# Patient Record
Sex: Female | Born: 1945 | Race: White | Hispanic: No | Marital: Married | Smoking: Never smoker
Health system: Southern US, Community
[De-identification: ages and names within clinical notes are randomized; demographics above are authoritative.]

## PROBLEM LIST (undated history)

## (undated) DIAGNOSIS — I10 Essential (primary) hypertension: Secondary | ICD-10-CM

## (undated) DIAGNOSIS — E785 Hyperlipidemia, unspecified: Secondary | ICD-10-CM

## (undated) DIAGNOSIS — H1010 Acute atopic conjunctivitis, unspecified eye: Secondary | ICD-10-CM

## (undated) HISTORY — DX: Hyperlipidemia, unspecified: E78.5

## (undated) HISTORY — PX: TONSILLECTOMY: SUR1361

## (undated) HISTORY — PX: FOOT SURGERY: SHX648

## (undated) HISTORY — DX: Acute atopic conjunctivitis, unspecified eye: H10.10

## (undated) HISTORY — DX: Essential (primary) hypertension: I10

---

## 1998-10-29 ENCOUNTER — Other Ambulatory Visit: Admission: RE | Admit: 1998-10-29 | Discharge: 1998-10-29 | Payer: Self-pay | Admitting: Gynecology

## 1999-12-16 ENCOUNTER — Other Ambulatory Visit: Admission: RE | Admit: 1999-12-16 | Discharge: 1999-12-16 | Payer: Self-pay | Admitting: Gynecology

## 2001-03-22 ENCOUNTER — Other Ambulatory Visit: Admission: RE | Admit: 2001-03-22 | Discharge: 2001-03-22 | Payer: Self-pay | Admitting: Gynecology

## 2002-10-01 ENCOUNTER — Other Ambulatory Visit: Admission: RE | Admit: 2002-10-01 | Discharge: 2002-10-01 | Payer: Self-pay | Admitting: Gynecology

## 2004-03-18 ENCOUNTER — Ambulatory Visit: Payer: Self-pay | Admitting: Internal Medicine

## 2004-08-23 ENCOUNTER — Ambulatory Visit: Payer: Self-pay | Admitting: Internal Medicine

## 2004-08-30 ENCOUNTER — Other Ambulatory Visit: Admission: RE | Admit: 2004-08-30 | Discharge: 2004-08-30 | Payer: Self-pay | Admitting: Gynecology

## 2004-09-13 ENCOUNTER — Ambulatory Visit: Payer: Self-pay | Admitting: Internal Medicine

## 2005-01-26 ENCOUNTER — Ambulatory Visit: Payer: Self-pay | Admitting: Internal Medicine

## 2005-05-05 ENCOUNTER — Ambulatory Visit: Payer: Self-pay | Admitting: Internal Medicine

## 2005-09-27 ENCOUNTER — Ambulatory Visit: Payer: Self-pay | Admitting: Internal Medicine

## 2005-11-17 ENCOUNTER — Ambulatory Visit: Payer: Self-pay | Admitting: Internal Medicine

## 2006-02-20 ENCOUNTER — Ambulatory Visit: Payer: Self-pay | Admitting: Internal Medicine

## 2006-05-23 ENCOUNTER — Ambulatory Visit: Payer: Self-pay | Admitting: Internal Medicine

## 2006-09-27 ENCOUNTER — Ambulatory Visit: Payer: Self-pay | Admitting: Internal Medicine

## 2006-11-27 ENCOUNTER — Ambulatory Visit: Payer: Self-pay | Admitting: Internal Medicine

## 2007-06-13 ENCOUNTER — Ambulatory Visit: Payer: Self-pay | Admitting: Internal Medicine

## 2007-09-26 DIAGNOSIS — J302 Other seasonal allergic rhinitis: Secondary | ICD-10-CM | POA: Insufficient documentation

## 2007-09-26 DIAGNOSIS — H1045 Other chronic allergic conjunctivitis: Secondary | ICD-10-CM | POA: Insufficient documentation

## 2007-09-26 DIAGNOSIS — J3089 Other allergic rhinitis: Secondary | ICD-10-CM

## 2007-09-26 DIAGNOSIS — J4520 Mild intermittent asthma, uncomplicated: Secondary | ICD-10-CM | POA: Insufficient documentation

## 2007-09-27 ENCOUNTER — Ambulatory Visit: Payer: Self-pay | Admitting: Internal Medicine

## 2008-01-22 ENCOUNTER — Ambulatory Visit: Payer: Self-pay | Admitting: Internal Medicine

## 2008-08-05 ENCOUNTER — Ambulatory Visit: Payer: Self-pay | Admitting: Internal Medicine

## 2008-09-05 ENCOUNTER — Ambulatory Visit (HOSPITAL_BASED_OUTPATIENT_CLINIC_OR_DEPARTMENT_OTHER): Admission: RE | Admit: 2008-09-05 | Discharge: 2008-09-05 | Payer: Self-pay | Admitting: Urology

## 2008-09-05 ENCOUNTER — Encounter (INDEPENDENT_AMBULATORY_CARE_PROVIDER_SITE_OTHER): Payer: Self-pay | Admitting: Urology

## 2008-11-13 ENCOUNTER — Ambulatory Visit: Payer: Self-pay | Admitting: Internal Medicine

## 2009-04-01 ENCOUNTER — Ambulatory Visit: Payer: Self-pay | Admitting: Internal Medicine

## 2009-04-10 ENCOUNTER — Telehealth: Payer: Self-pay | Admitting: Internal Medicine

## 2009-10-22 ENCOUNTER — Ambulatory Visit: Payer: Self-pay | Admitting: Internal Medicine

## 2009-11-13 ENCOUNTER — Ambulatory Visit: Payer: Self-pay | Admitting: Internal Medicine

## 2010-03-09 NOTE — Progress Notes (Signed)
Summary: rx request   Phone Note Call from Patient Call back at Home Phone [phone]   Caller: Patient Call For: [name] [name] Summary of Call: pt states she was told to call when she feels she needs a rx for DOXYCYCLINE. [facility] [place] Initial call taken by: Tivis Ringer, CNA,  April 10, 2009 3:18 PM  Follow-up for Phone Call        called pt's home #.  Spoke to spouse.  Spouse states pt is currently at work.  Gave me work #  [id number] and cell #  [phone].  [id number].  Aundra Millet Reynolds LPN  April 11, 979 4:44 PM    Additional Follow-up for Phone Call Additional follow up Details #1::        Called and spoke with pt.  She c/o head congestion, "gooky eyes", and dry cough x 4 days.  She states that she needs doxycycline called in so that "it does'nt go to my chest". Please advise, thanks Vernie Murders  April 13, 2009 4:55 PM NKDA     Additional Follow-up for Phone Call Additional follow up Details #2::    please send script doxycycline 100 mg, # 8,      2 today then one daily. Follow-up by: Waymon Budge MD,  April 13, 2009 5:08 PM  Additional Follow-up for Phone Call Additional follow up Details #3:: Details for Additional Follow-up Action Taken: rx sent. pt aware. Carron Curie CMA  April 13, 2009 5:18 PM   New/Updated Medications: DOXYCYCLINE HYCLATE 100 MG TABS (DOXYCYCLINE HYCLATE) 2 today then one daily Prescriptions: DOXYCYCLINE HYCLATE 100 MG TABS (DOXYCYCLINE HYCLATE) 2 today then one daily  #8 x 0   Entered by:   Carron Curie CMA   Authorized by:   Waymon Budge MD   Signed by:   Carron Curie CMA on 04/13/2009   Method used:   [facility]        [facility] [address]       [address]       [place], [place]  [address]       Ph: [phone] or [phone]       Fax: [phone]   RxID:   [id number]

## 2010-03-09 NOTE — Assessment & Plan Note (Signed)
Summary: 1 year/ mbw   Primary Provider/Referring Provider:  Sigmund Hazel   CC:  Yearly follow up visit-allergies; no major flare ups..  History of Present Illness: 65 year old woman returning for one year follow-up of allergic rhinitis and asthma.  Never smoked.  This has  been a good summer.  One.  Viral syndrome bronchitis episode, resolved.  ICH, and burn despite using Pataday eyedrops on a daily basis.  Allegra-D p.r.n. Never much asthma.  Wears a mask when she is working in the attic to keep out dust.  November 13, 2008- Allergic rhinitis, asthma, conjunctivitis Very good past year. No significant asthma since last here. Uses Mucinex early if she senses trouble. allergy vaccine has served her well with no problems. Rarely needs doxycycline. Rare need for Allegra-D. Uses rescue inhaler very occasionally- discussed.  November 13, 2009- Allergic rhinitis, asthma, conjunctivitis cc: Yearly follow up visit-allergies; no major flare ups. Continues allergy vaccine. No real problem with seasonal changes and she notes lack of any acute attacks requiring visits, so it's been a good year. Hasn't needed Allegra D and finds mucinex -DM works best for her. Needs Epipen refill. Asks standby script for doxycycline. Needs flu vax. Rarely gets a little chest tightness- not enough to need treatment. Denies discomfort with eyes or ears lately.    Asthma History    Initial Asthma Severity Rating:    Age range: 12+ years    Symptoms: 0-2 days/week    Nighttime Awakenings: 0-2/month    Interferes w/ normal activity: no limitations    SABA use (not for EIB): 0-2 days/week    Asthma Severity Assessment: Intermittent   Preventive Screening-Counseling & Management  Alcohol-Tobacco     Smoking Status: never     Passive Smoke Exposure: no  Current Medications (verified): 1)  Bayer Aspirin Ec Low Dose 81 Mg  Tbec (Aspirin) .... Take 1 Tablet By Mouth Once A Day 2)  Ventolin Hfa 108 (90 Base) Mcg/act Aers  (Albuterol Sulfate) .... 2 Puffs Four Times A Day As Needed Rescue 3)  Allegra-D 12 Hour 60-120 Mg  Xr12h-Tab (Fexofenadine-Pseudoephedrine) .... Take 1 Tablet By Mouth Two Times A Day As Needed 4)  Pataday 0.2 %  Soln (Olopatadine Hcl) .Marland Kitchen.. 1 Spray Each Nostril Once Daily As Needed 5)  Epipen 0.3 Mg/0.45ml (1:1000)  Devi (Epinephrine Hcl (Anaphylaxis)) .... Use As Directed As Needed For Anaphylaxis 6)  Allergy Vaccine (W-E) Go .Marland Kitchen.. 1:10 7)  Amitriptyline Hcl 25 Mg Tabs (Amitriptyline Hcl) .... Take 1 By Mouth Once Daily 8)  Gnp Antacid Ultra Strength 1000 Mg Chew (Calcium Carbonate Antacid) .... Take 1 By Mouth Once Daily 9)  Vitamin D 1000 Unit Tabs (Cholecalciferol) .... Take 2 By Mouth Once Daily 10)  Multivitamins  Tabs (Multiple Vitamin) .... Take 1 By Mouth Once Daily 11)  Potassium 99 Mg Tabs (Potassium) .... Take 1 By Mouth Once Daily 12)  Doxycycline Hyclate 100 Mg Tabs (Doxycycline Hyclate) .... 2 Today Then One Daily  Allergies (verified): No Known Drug Allergies  Past History:  Past Medical History: Last updated: 09/27/2007 ALLERGIC CONJUNCTIVITIS (ICD-372.14) ALLERGIC RHINITIS (ICD-477.9) ASTHMA (ICD-493.90)  Past Surgical History: Last updated: 11/13/2008 Foot surgery Tonsillectomy  Family History: Last updated: 10-07-2007 Mother- deceased age 38; Allergies, Asthma, Heart Disease, Arthritis, Breast Cancer Father- living age 47; DM, Allergies, Heart Disease Sibling 1- living age 64  Social History: Last updated: 2007/10/07 Patient never smoked.  Negative history of passive tobacco smoke exposure.  Exercise- 3 times weekly Caffeine  Use- no ETOH- 1 drink weekly Married with 2 children   Risk Factors: Smoking Status: never (11/13/2009) Passive Smoke Exposure: no (11/13/2009)  Review of Systems      See HPI  The patient denies shortness of breath with activity, shortness of breath at rest, productive cough, non-productive cough, coughing up blood, chest  pain, irregular heartbeats, indigestion, loss of appetite, weight change, abdominal pain, difficulty swallowing, sore throat, tooth/dental problems, headaches, nasal congestion/difficulty breathing through nose, sneezing, itching, ear ache, rash, change in color of mucus, and fever.         Increased reflux and dry mouth blamed on amitriptyline she takes for her bladder.  Vital Signs:  Patient profile:   65 year old female Height:      66.5 inches Weight:      177.50 pounds BMI:     28.32 O2 Sat:      96 % on Room air Pulse rate:   88 / minute BP sitting:   122 / 76  (left arm) Cuff size:   regular  Vitals Entered By: Reynaldo Minium CMA (November 13, 2009 9:34 AM)  O2 Flow:  Room air CC: Yearly follow up visit-allergies; no major flare ups.   Physical Exam  Additional Exam:  General: A/Ox3; pleasant and cooperative, NAD, SKIN: no rash, lesions NODES: no lymphadenopathy NECK: Supple w/ fair ROM, JVD- none, normal carotid impulses w/o bruits Thyroid- normal to palpation, CHEST: Clear to P&A HEART: RRR, no m/g/r heard ABDOMEN: Soft and nl; nml bowel sounds;  ZOX:WRUE, nl pulses, no edema  NEURO: Grossly intact to observation HEENT: [place]/AT, EOM- wnl, Conjunctivae- clear, contact lenses, PERRLA, TMs- wnl, Nose- clear, Throat- clear and wnl, Mallampati II     Impression & Recommendations:  Problem # 1:  ALLERGIC RHINITIS (ICD-477.9)  Good control over the past year. She will continue present meds and allergy vaccine. Amitriptyline likely helps rhinorhea some through drying effect. Refilling Epipen.  Problem # 2:  ASTHMA (ICD-493.90) Minimal intermittent asthma, noted and mainly historical with no treatment needed.   Medications Added to Medication List This Visit: 1)  Doxycycline Hyclate 100 Mg Caps (Doxycycline hyclate) .... 2 today then one daily  Other Orders: Est. Patient Level III (45409)  Patient Instructions: 1)  Please schedule a follow-up appointment in 1  year. 2)  Good control with no additional interventions needed. 3)  Flu vax Prescriptions: EPIPEN 0.3 MG/0.3ML (1:1000)  DEVI (EPINEPHRINE HCL (ANAPHYLAXIS)) Use as directed as needed for anaphylaxis  #1 x prn   Entered and Authorized by:   Waymon Budge MD   Signed by:   Waymon Budge MD on 11/13/2009   Method used:   Print then Give to Patient   RxID:   [id number] DOXYCYCLINE HYCLATE 100 MG CAPS (DOXYCYCLINE HYCLATE) 2 today then one daily  #8 x 3   Entered and Authorized by:   Waymon Budge MD   Signed by:   Waymon Budge MD on 11/13/2009   Method used:   Print then Give to Patient   RxID:   [id number]     Appended Document: flu vaccine documentation Flu Vaccine Consent Questions     Do you have a history of severe allergic reactions to this vaccine? no    Any prior history of allergic reactions to egg and/or gelatin? no    Do you have a sensitivity to the preservative Thimersol? no    Do you have a past history of Guillan-Barre Syndrome? no  Do you currently have an acute febrile illness? no    Have you ever had a severe reaction to latex? no    Vaccine information given and explained to patient? yes    Are you currently pregnant? no    Lot Number:[id number]   Exp Date:08/07/2010   Site Given Right  Deltoid IM Armita Galloway RCP, LPN  November 13, 2009 11:01 AM    Clinical Lists Changes  Orders: Added new Service order of Admin 1st Vaccine (16109) - Signed Added new Service order of Flu Vaccine 67yrs + [phone]) - Signed Observations: Added new observation of FLU VAX VIS: 09/01/09 version (11/13/2009 11:00) Added new observation of FLU VAXLOT: [id number] (11/13/2009 11:00) Added new observation of FLU VAXMFR: Glaxosmithkline (11/13/2009 11:00) Added new observation of FLU VAX EXP: 08/07/2010 (11/13/2009 11:00) Added new observation of FLU VAX DSE: 0.46ml (11/13/2009 11:00) Added new observation of FLU VAX: Fluvax 3+ (11/13/2009 11:00)

## 2010-06-22 NOTE — Assessment & Plan Note (Signed)
[facility]                             PULMONARY OFFICE NOTE   NAME:[name] [name] KJOS                       MRN:          [id number]  DATE:09/27/2006                            DOB:          1945/02/12    PROBLEM:  1. Asthma.  2. Allergic rhinitis.  3. Allergic conjunctivitis.   HISTORY:  A six month followup. This has been a good summer except that  she has to use pataday eye drops every day for watery eyes. She actually  does better if she wears her contact lenses. Otherwise, she has done  quite well.   MEDICATIONS:  1. Allergy vaccine continues it at 1:10 with no problems.  2. Black cohosh.  3. Aspirin 81 mg.  4. Allegra D 12 hour.  5. P.r.n. use of doxycycline.  6. Pataday eye drops.  7. She has an Epipen.   DRUG INTOLERANCES:  No medication allergy.   OBJECTIVE:  Weight 161 pounds, blood pressure 112/64, pulse 68.  Room  air saturation: 98%.  She looks comfortable.  Conjunctivae are not injected. Secretions are clear. Nose and throat are  clear.  CHEST: Clear.  HEART: Sounds regular without murmur.   IMPRESSION:  Stable management of allergic rhinitis, conjunctivitis and  possibly mild intermittent asthma.   PLAN:  1. Continue vaccine.  2. Continue Pataday with some discussion. She will drop back enough to      make sure she still needs it.  3. We refilled doxycycline #8 to last seven days for appropriate      p.r.n. use as discussed.  4. I have refilled Allegra D 12 hour one b.i.d. p.r.n. which she      prefers over Claritin or Zyrtec.  5. Schedule return in 12 months, earlier p.r.n.     Clinton D. Maple Hudson, MD, Tonny Bollman, FACP  Electronically Signed    CDY/MedQ  DD: 09/27/2006  DT: 09/28/2006  Job #: [id number]   cc:   Sigmund Hazel, M.D.

## 2010-06-22 NOTE — Op Note (Signed)
NAMEBROOKELYN, GAYNOR NO.:  [id number]   MEDICAL RECORD NO.:  [id number]          PATIENT TYPE:  AMB   LOCATION:  [facility]                         FACILITY:  [facility]   PHYSICIAN:  Maretta Bees. Vonita Moss, M.D.DATE OF BIRTH:  [birth year]   DATE OF PROCEDURE:  09/05/2008  DATE OF DISCHARGE:                               OPERATIVE REPORT   PREOPERATIVE DIAGNOSIS:  Rule out interstitial cystitis.   POSTOPERATIVE DIAGNOSIS:  Interstitial cystitis.   PROCEDURES:  Cystoscopy, hydraulic overdistention of bladder and cold  cup bladder biopsy.   SURGEON:  Dr. Larey Dresser.   ANESTHESIA:  General.   INDICATIONS:  This 65 year old lady has had an 13-month history of  progressive frequency of urination.  She voids every 15 minutes.  She  has burning in the bladder region.  She has nocturia times three.  She  also has some urinary leakage.  The pelvic pain and symptom score was  quite elevated to 28.  I feel like she had interstitial cystitis.  I  advised her about cysto and HOD and bladder biopsy and the risk of  bladder perforation and postop pain and hematuria.   PROCEDURE:  The patient brought to the operating room and placed in  lithotomy position.  External genitalia were prepped and draped in usual  fashion.  The bladder appeared initially to be somewhat pale and mucosa  was generally abnormal-appearing in that regard.  After only filling her  with 200 or 300 mL she started getting petechiae and hemorrhage and held  less than 500 mL.  She had widespread submucosal petechiae and gross  hemorrhage.  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  There was no evidence of bladder perforation.  I  did cold cup bladder biopsies in typical hemorrhagic areas across the  bladder base and fulgurated these biopsy sites.  At this point the  bleeding had stopped significantly.  The bladder was emptied, the scope  removed.  The patient sent to recovery  room in good condition.  B and O  suppository was inserted per rectum.  She tolerated the procedure well.  Blood loss was minimal.      Maretta Bees. Vonita Moss, M.D.  Electronically Signed     LJP/MEDQ  D:  09/05/2008  T:  09/05/2008  Job:  [id number]

## 2010-06-25 NOTE — Assessment & Plan Note (Signed)
[name] [name]                             PULMONARY OFFICE NOTE   [name] [name], [name] [name]                       MRN:          [id number]  DATE:02/20/2006                            DOB:          Jan 09, 1946    February 20, 2006   PROBLEMS:  1. Asthma.  2. Allergic rhinitis.  3. Allergic conjunctivitis.   HISTORY:  She has had cough without sore throat.  There has been some  nasal congestion and post nasal drainage.  Mucinex helped.  She is not  bringing up any sputum.  There has been no fever.  She continues her  allergy vaccine at 1:10 with no problems.   MEDICATIONS:  Allergy vaccine, black kohash, aspirin, Allegra D, p.r.n.  use of Patanol eye drops, Epipen is available.   ALLERGIES:  NO KNOWN DRUG ALLERGIES.   OBJECTIVE:  VITAL SIGNS:  Weight 174 pounds, blood pressure 142/84,  pulse 61, room air saturation 100%.  HEENT:  Her nose is slightly congested.  CHEST:  Wheezy cough.  No accessory muscle use.  CARDIOVASCULAR:  Normal heart sounds.   IMPRESSION:  Exacerbation of asthma, probably after a viral infection.  Some increase in rhinitis.  I do not think she has sinusitis yet.   PLAN:  1. Nebulizer treatment with Xopenex 0.63.  2. Depo 80.  3. Keep scheduled appointment for the spring, earlier p.r.n.     Clinton D. Maple Hudson, MD, Tonny Bollman, FACP  Electronically Signed    CDY/MedQ  DD: 02/20/2006  DT: 02/21/2006  Job #: [id number]

## 2010-06-25 NOTE — Assessment & Plan Note (Signed)
[facility]                               PULMONARY OFFICE NOTE   NAME:[name] [name], [name] [name]                       MRN:          [id number]  DATE:09/27/2005                            DOB:          05-Aug-1945    PROBLEM:  1. Asthma.  2. Allergic rhinitis.  3. Allergic conjunctivitis.   HISTORY:  One-year followup.  She was last seen here in December by the  nurse practitioner with an acute bronchitis exacerbation treated with  Omnicef and a prednisone taper.  Since then, she has done well, except for  complaints.  Her eyes excessively at night or at least by night time.  She  has continued medication including Allegra D, Patanol, aspirin 81 mg and her  allergy vaccine 1-10.  She gives her own vaccine.  There have been no  problems, but we spent time again today discussing risks, benefit and goals  of allergy vaccine therapy, issues of administration outside the medical  office, anaphylaxis, epinephrine and choices.  She wishes to continue as she  is doing and has reviewed and signed our waiver.  She is given a refill on  her Epi-Pen prescription.   OBJECTIVE:  VITAL SIGNS:  Weight 168 pounds, BP 138/78, pulse regular 59,  room air saturation 100%.  HEENT:  She is wearing contact lenses with no conjunctival injection.  Secretions are clear.  Her nasal mucosa looks normal and is unobstructed.  Pharynx is clear.  LUNGS:  Clear to P&A.  HEART:  Sounds regular without murmur.   IMPRESSION:  Allergic rhinitis, possible non-specific overflow lacrimation,  rare mild asthma.   PLAN:  1. Refill Allegra-D 12 hour.  2. Risk education discussion as above with epinephrine refill waiver      signed.  3. Lacrilube ointment.  4. Standby prescription at her request for doxycycline, a 7-day taper to      take if needed, with appropriate discussion.  5. Schedule return in one year, earlier p.r.n.                                   Clinton D. Maple Hudson, MD, FCCP,  FACP   CDY/MedQ  DD:  09/29/2005  DT:  09/29/2005  Job #:  [id number]

## 2010-11-10 ENCOUNTER — Telehealth: Payer: Self-pay | Admitting: Pulmonary Disease

## 2010-11-10 NOTE — Telephone Encounter (Signed)
ERROR: WRONG PT ///KP

## 2010-11-11 ENCOUNTER — Encounter: Payer: Self-pay | Admitting: Internal Medicine

## 2010-11-12 ENCOUNTER — Ambulatory Visit (INDEPENDENT_AMBULATORY_CARE_PROVIDER_SITE_OTHER): Payer: BC Managed Care – PPO | Admitting: Internal Medicine

## 2010-11-12 ENCOUNTER — Encounter: Payer: Self-pay | Admitting: Internal Medicine

## 2010-11-12 VITALS — BP 124/82 | HR 85 | Ht 66.5 in | Wt 176.0 lb

## 2010-11-12 DIAGNOSIS — J45909 Unspecified asthma, uncomplicated: Secondary | ICD-10-CM

## 2010-11-12 DIAGNOSIS — Z23 Encounter for immunization: Secondary | ICD-10-CM

## 2010-11-12 DIAGNOSIS — H1045 Other chronic allergic conjunctivitis: Secondary | ICD-10-CM

## 2010-11-12 DIAGNOSIS — J309 Allergic rhinitis, unspecified: Secondary | ICD-10-CM

## 2010-11-12 MED ORDER — ALBUTEROL SULFATE HFA 108 (90 BASE) MCG/ACT IN AERS: 2.0000 | INHALATION_SPRAY | RESPIRATORY_TRACT | Status: DC | PRN

## 2010-11-12 MED ORDER — EPINEPHRINE 0.3 MG/0.3ML IJ DEVI: 0.3000 mg | INTRAMUSCULAR | Status: DC | PRN

## 2010-11-12 MED ORDER — OLOPATADINE HCL 0.2 % OP SOLN: 1.0000 [drp] | OPHTHALMIC | Status: AC

## 2010-11-12 NOTE — Patient Instructions (Addendum)
Flu vax  Refill scripts for Epipen, Ventolin rescue inhaler and Pataday  Ask your eye doctor if you could use a cheaper otc allergy eye drop like Visine AC, Naphcon or Allaway  Please call as needed

## 2010-11-12 NOTE — Progress Notes (Signed)
11/12/10-65 year old female never smoker followed for asthma, allergic rhinitis Last here 11/13/2009 She continues allergy vaccine without difficulties. Needs update on EpiPen. Overall this has been a good year and she has no concerns. Eyes burn and water at times. Pataday eyedrops are expensive, but she wears contact lenses and is limited in what she can use. We discussed some over-the-counter products which she can ask her eye doctor about. Has had one or 2 very mild episodes of chest tightness with little need for rescue inhaler. She does want to keep her rescue inhaler available. Asks flu vaccine.  ROS Constitutional:   No-   weight loss, night sweats, fevers, chills, fatigue, lassitude. HEENT:   No-  headaches, difficulty swallowing, tooth/dental problems, sore throat,       No-  sneezing, itching, ear ache, nasal congestion, post nasal drip,  CV:  No-   chest pain, orthopnea, PND, swelling in lower extremities, anasarca, dizziness, palpitations Resp: No-   shortness of breath with exertion or at rest.              No-   productive cough,  No non-productive cough,  No-  coughing up of blood.              No-   change in color of mucus.  Very minimal wheezing.   Skin: No-   rash or lesions. GI:  No-   heartburn, indigestion, abdominal pain, nausea, vomiting, diarrhea,                 change in bowel habits, loss of appetite GU: No-   dysuria, change in color of urine, no urgency or frequency.  No- flank pain. MS:  No-   joint pain or swelling.  No- decreased range of motion.  No- back pain. Neuro- grossly normal to observation, Or:  Psych:  No- change in mood or affect. No depression or anxiety.  No memory loss.  OBJ General- Alert, Oriented, Affect-appropriate, Distress- none acute Skin- rash-none, lesions- none, excoriation- none Lymphadenopathy- none Head- atraumatic            Eyes- Gross vision intact, PERRLA, conjunctivae clear secretions            Ears- Hearing, canals-normal            Nose- Clear, no-Septal dev, mucus, polyps, erosion, perforation             Throat- Mallampati III , mucosa clear , drainage- none, tonsils- atrophic Neck- flexible , trachea midline, no stridor , thyroid nl, carotid no bruit Chest - symmetrical excursion , unlabored           Heart/CV- RRR , no murmur , no gallop  , no rub, nl s1 s2                           - JVD- none , edema- none, stasis changes- none, varices- none           Lung- clear to P&A, wheeze- none, cough- none , dullness-none, rub- none           Chest wall-  Abd- tender-no, distended-no, bowel sounds-present, HSM- no Br/ Gen/ Rectal- Not done, not indicated Extrem- cyanosis- none, clubbing, none, atrophy- none, strength- nl Neuro- grossly intact to observation

## 2010-11-14 NOTE — Assessment & Plan Note (Signed)
We will refill her Ventolin rescue inhaler as discussed, but she may go most of the year without needing it.

## 2010-11-14 NOTE — Assessment & Plan Note (Signed)
She will ask her ophthalmologist about cheaper alternatives to Pataday eyedrops

## 2010-11-14 NOTE — Assessment & Plan Note (Signed)
Good control. She is satisfied

## 2010-12-13 ENCOUNTER — Other Ambulatory Visit: Payer: Self-pay | Admitting: Gynecology

## 2011-03-22 ENCOUNTER — Ambulatory Visit (INDEPENDENT_AMBULATORY_CARE_PROVIDER_SITE_OTHER): Payer: BC Managed Care – PPO

## 2011-03-22 DIAGNOSIS — J309 Allergic rhinitis, unspecified: Secondary | ICD-10-CM

## 2011-11-14 ENCOUNTER — Ambulatory Visit (INDEPENDENT_AMBULATORY_CARE_PROVIDER_SITE_OTHER): Payer: BC Managed Care – PPO | Admitting: Internal Medicine

## 2011-11-14 ENCOUNTER — Encounter: Payer: Self-pay | Admitting: Internal Medicine

## 2011-11-14 VITALS — BP 122/64 | HR 91 | Ht 66.5 in | Wt 181.4 lb

## 2011-11-14 DIAGNOSIS — Z23 Encounter for immunization: Secondary | ICD-10-CM

## 2011-11-14 DIAGNOSIS — J309 Allergic rhinitis, unspecified: Secondary | ICD-10-CM

## 2011-11-14 DIAGNOSIS — H1045 Other chronic allergic conjunctivitis: Secondary | ICD-10-CM

## 2011-11-14 DIAGNOSIS — J45909 Unspecified asthma, uncomplicated: Secondary | ICD-10-CM

## 2011-11-14 MED ORDER — EPINEPHRINE 0.3 MG/0.3ML IJ DEVI: 0.3000 mg | INTRAMUSCULAR | Status: AC | PRN

## 2011-11-14 MED ORDER — ALBUTEROL SULFATE HFA 108 (90 BASE) MCG/ACT IN AERS: 2.0000 | INHALATION_SPRAY | RESPIRATORY_TRACT | Status: DC | PRN

## 2011-11-14 NOTE — Patient Instructions (Addendum)
Refill scripts for Epipen and for rescue inhaler sent  We can continue allergy vaccine  Flu Vax  TDAP vax  Please call as needed

## 2011-11-14 NOTE — Progress Notes (Signed)
11/12/10-66 year old female never smoker followed for asthma, allergic rhinitis Last here 11/13/2009 She continues allergy vaccine without difficulties. Needs update on EpiPen. Overall this has been a good year and she has no concerns. Eyes burn and water at times. Pataday eyedrops are expensive, but she wears contact lenses and is limited in what she can use. We discussed some over-the-counter products which she can ask her eye doctor about. Has had one or 2 very mild episodes of chest tightness with little need for rescue inhaler. She does want to keep her rescue inhaler available. Asks flu vaccine.  11/14/11- 66 year old female never smoker followed for asthma, allergic rhinitis Slight cough from weather changes but not productive; still on allergy vaccine 1:10 GO and doing well. Has done well overall for the past year and satisfied with treatment. Has changed to an over-the-counter eyedrop.  ROS- see HPI Constitutional:   No-   weight loss, night sweats, fevers, chills, fatigue, lassitude. HEENT:   No-  headaches, difficulty swallowing, tooth/dental problems, sore throat,       No-  sneezing, itching, ear ache, nasal congestion, post nasal drip,  CV:  No-   chest pain, orthopnea, PND, swelling in lower extremities, anasarca, dizziness, palpitations Resp: No-   shortness of breath with exertion or at rest.              No-   productive cough,  No non-productive cough,  No-  coughing up of blood.              No-   change in color of mucus.  No- wheezing.   Skin: No-   rash or lesions. GI:  No-   heartburn, indigestion, abdominal pain, nausea, vomiting, GU: . MS:  No-   joint pain or swelling.  . Neuro- nothing unusual Psych:  No- change in mood or affect. No depression or anxiety.  No memory loss.  OBJ General- Alert, Oriented, Affect-appropriate, Distress- none acute Skin- rash-none, lesions- none, excoriation- none Lymphadenopathy- none Head- atraumatic            Eyes- Gross vision  intact, PERRLA, conjunctivae + slightly injected            Ears- Hearing, canals-normal            Nose- Clear, no-Septal dev, mucus, polyps, erosion, perforation             Throat- Mallampati III , mucosa clear , drainage- none, tonsils- atrophic Neck- flexible , trachea midline, no stridor , thyroid nl, carotid no bruit Chest - symmetrical excursion , unlabored           Heart/CV- RRR , no murmur , no gallop  , no rub, nl s1 s2                           - JVD- none , edema- none, stasis changes- none, varices- none           Lung- clear to P&A, wheeze- none, cough- none , dullness-none, rub- none           Chest wall-  Abd-  Br/ Gen/ Rectal- Not done, not indicated Extrem- cyanosis- none, clubbing, none, atrophy- none, strength- nl Neuro- grossly intact to observation

## 2011-11-20 NOTE — Assessment & Plan Note (Signed)
Over-the-counter allergy eyedrops is more cost effective for her than Pataday

## 2011-11-20 NOTE — Assessment & Plan Note (Signed)
Plan-continue allergy vaccine. Refill EpiPen.

## 2011-11-20 NOTE — Assessment & Plan Note (Signed)
Good control. Plan-refill rescue inhaler. She requests TDAP and flu shots

## 2012-01-27 ENCOUNTER — Ambulatory Visit (INDEPENDENT_AMBULATORY_CARE_PROVIDER_SITE_OTHER): Payer: BC Managed Care – PPO

## 2012-01-27 DIAGNOSIS — J309 Allergic rhinitis, unspecified: Secondary | ICD-10-CM

## 2012-11-13 ENCOUNTER — Ambulatory Visit: Payer: BC Managed Care – PPO | Admitting: Internal Medicine

## 2012-12-10 ENCOUNTER — Other Ambulatory Visit: Payer: Self-pay | Admitting: Gynecology

## 2012-12-25 ENCOUNTER — Encounter: Payer: Self-pay | Admitting: Internal Medicine

## 2012-12-25 ENCOUNTER — Ambulatory Visit (INDEPENDENT_AMBULATORY_CARE_PROVIDER_SITE_OTHER): Payer: BC Managed Care – PPO | Admitting: Internal Medicine

## 2012-12-25 ENCOUNTER — Encounter (INDEPENDENT_AMBULATORY_CARE_PROVIDER_SITE_OTHER): Payer: Self-pay

## 2012-12-25 VITALS — BP 110/76 | HR 72 | Ht 66.0 in | Wt 160.2 lb

## 2012-12-25 DIAGNOSIS — J309 Allergic rhinitis, unspecified: Secondary | ICD-10-CM

## 2012-12-25 DIAGNOSIS — J4520 Mild intermittent asthma, uncomplicated: Secondary | ICD-10-CM

## 2012-12-25 DIAGNOSIS — J45909 Unspecified asthma, uncomplicated: Secondary | ICD-10-CM

## 2012-12-25 DIAGNOSIS — H1045 Other chronic allergic conjunctivitis: Secondary | ICD-10-CM

## 2012-12-25 MED ORDER — EPINEPHRINE 0.3 MG/0.3ML IJ SOAJ: INTRAMUSCULAR | Status: DC

## 2012-12-25 NOTE — Progress Notes (Signed)
11/12/10-67 year old female never smoker followed for asthma, allergic rhinitis Last here 11/13/2009 She continues allergy vaccine without difficulties. Needs update on EpiPen. Overall this has been a good year and she has no concerns. Eyes burn and water at times. Pataday eyedrops are expensive, but she wears contact lenses and is limited in what she can use. We discussed some over-the-counter products which she can ask her eye doctor about. Has had one or 2 very mild episodes of chest tightness with little need for rescue inhaler. She does want to keep her rescue inhaler available. Asks flu vaccine.  11/14/11- 67 year old female never smoker followed for asthma, allergic rhinitis Slight cough from weather changes but not productive; still on allergy vaccine 1:10 GO and doing well. Has done well overall for the past year and satisfied with treatment. Has changed to an over-the-counter eyedrop.  12/25/12- 67 year old female never smoker followed for asthma, allergic rhinitis FOLLOWS FOR: still on allergy vaccine 1:10 GO and doing well; denies any flare ups. Uses rescue inhaler once or twice a year. Zaditor eyedrops when she is not wearing contact lenses.  ROS- see HPI Constitutional:   No-   weight loss, night sweats, fevers, chills, fatigue, lassitude. HEENT:   No-  headaches, difficulty swallowing, tooth/dental problems, sore throat,       No-  sneezing, itching, ear ache, nasal congestion, post nasal drip,  CV:  No-   chest pain, orthopnea, PND, swelling in lower extremities, anasarca, dizziness, palpitations Resp: No-   shortness of breath with exertion or at rest.              No-   productive cough,  No non-productive cough,  No-  coughing up of blood.              No-   change in color of mucus.  No- wheezing.   Skin: No-   rash or lesions. GI:  No-   heartburn, indigestion, abdominal pain, nausea, vomiting, GU: . MS:  No-   joint pain or swelling.  . Neuro- nothing unusual Psych:   No- change in mood or affect. No depression or anxiety.  No memory loss.  OBJ General- Alert, Oriented, Affect-appropriate, Distress- none acute Skin- rash-none, lesions- none, excoriation- none Lymphadenopathy- none Head- atraumatic            Eyes- Gross vision intact, PERRLA, conjunctivae clear            Ears- Hearing, canals-normal            Nose- Clear, no-Septal dev, mucus, polyps, erosion, perforation             Throat- Mallampati III , mucosa clear , drainage- none, tonsils- atrophic Neck- flexible , trachea midline, no stridor , thyroid nl, carotid no bruit Chest - symmetrical excursion , unlabored           Heart/CV- RRR , no murmur , no gallop  , no rub, nl s1 s2                           - JVD- none , edema- none, stasis changes- none, varices- none           Lung- clear to P&A, wheeze- none, cough- none , dullness-none, rub- none           Chest wall-  Abd-  Br/ Gen/ Rectal- Not done, not indicated Extrem- cyanosis- none, clubbing, none, atrophy- none, strength- nl Neuro- grossly intact  to observation

## 2012-12-25 NOTE — Patient Instructions (Signed)
We can continue allergy vaccine 1:10 GO  Script for Epipen in case of severe allergic reaction  Ok to use an otc antihistamine if needed  Please call if we can help

## 2013-01-04 ENCOUNTER — Ambulatory Visit (INDEPENDENT_AMBULATORY_CARE_PROVIDER_SITE_OTHER): Payer: BC Managed Care – PPO

## 2013-01-04 DIAGNOSIS — J309 Allergic rhinitis, unspecified: Secondary | ICD-10-CM

## 2013-01-10 NOTE — Assessment & Plan Note (Signed)
Continue present treatment

## 2013-01-10 NOTE — Assessment & Plan Note (Signed)
Very occasional Zaditor eyedrops. She is usually able to wear her contact lenses

## 2013-01-10 NOTE — Assessment & Plan Note (Signed)
We can continue allergy vaccine. EpiPen discussed and refilled.

## 2013-10-03 ENCOUNTER — Ambulatory Visit (INDEPENDENT_AMBULATORY_CARE_PROVIDER_SITE_OTHER): Payer: Medicare Other

## 2013-10-03 DIAGNOSIS — J309 Allergic rhinitis, unspecified: Secondary | ICD-10-CM

## 2013-12-13 ENCOUNTER — Encounter: Payer: Self-pay | Admitting: Internal Medicine

## 2013-12-25 ENCOUNTER — Ambulatory Visit: Payer: BC Managed Care – PPO | Admitting: Internal Medicine

## 2014-02-03 ENCOUNTER — Ambulatory Visit: Payer: Medicare Other | Admitting: Internal Medicine

## 2014-02-20 ENCOUNTER — Ambulatory Visit (INDEPENDENT_AMBULATORY_CARE_PROVIDER_SITE_OTHER): Payer: Medicare Other | Admitting: Internal Medicine

## 2014-02-20 VITALS — BP 138/70 | HR 97 | Ht 66.5 in | Wt 176.4 lb

## 2014-02-20 DIAGNOSIS — J309 Allergic rhinitis, unspecified: Secondary | ICD-10-CM

## 2014-02-20 DIAGNOSIS — J3089 Other allergic rhinitis: Secondary | ICD-10-CM

## 2014-02-20 DIAGNOSIS — J302 Other seasonal allergic rhinitis: Secondary | ICD-10-CM

## 2014-02-20 DIAGNOSIS — J4520 Mild intermittent asthma, uncomplicated: Secondary | ICD-10-CM

## 2014-02-20 MED ORDER — EPINEPHRINE 0.3 MG/0.3ML IJ SOAJ: INTRAMUSCULAR | Status: DC

## 2014-02-20 MED ORDER — ALBUTEROL SULFATE HFA 108 (90 BASE) MCG/ACT IN AERS: 2.0000 | INHALATION_SPRAY | RESPIRATORY_TRACT | Status: DC | PRN

## 2014-02-20 NOTE — Patient Instructions (Signed)
Refill script for albuterol rescue inhaler sent  Refill for Epipen printed, in case of severe allergic reaction to the allergy vaccine  We can continue allergy vaccine 1:10 GO  Please call as needed

## 2014-02-20 NOTE — Progress Notes (Signed)
11/12/10-69 year old female never smoker followed for asthma, allergic rhinitis Last here 11/13/2009 She continues allergy vaccine without difficulties. Needs update on EpiPen. Overall this has been a good year and she has no concerns. Eyes burn and water at times. Pataday eyedrops are expensive, but she wears contact lenses and is limited in what she can use. We discussed some over-the-counter products which she can ask her eye doctor about. Has had one or 2 very mild episodes of chest tightness with little need for rescue inhaler. She does want to keep her rescue inhaler available. Asks flu vaccine.  11/14/11- 69 year old female never smoker followed for asthma, allergic rhinitis Slight cough from weather changes but not productive; still on allergy vaccine 1:10 GO and doing well. Has done well overall for the past year and satisfied with treatment. Has changed to an over-the-counter eyedrop.  12/25/12- 69 year old female never smoker followed for asthma, allergic rhinitis FOLLOWS FOR: still on allergy vaccine 1:10 GO and doing well; denies any flare ups. Uses rescue inhaler once or twice a year. Zaditor eyedrops when she is not wearing contact lenses.  02/20/14- 69 year old female never smoker followed for asthma, allergic rhinitis, allergic conjunctivitis Follows For: Denies sob, wheezing, cough ,runny nose or sneezing.  PT feeling well allergy vaccine 1:10 GO She considers the effect of allergy vaccine "fantastic" and wishes no changes  ROS- see HPI Constitutional:   No-   weight loss, night sweats, fevers, chills, fatigue, lassitude. HEENT:   No-  headaches, difficulty swallowing, tooth/dental problems, sore throat,       No-  sneezing, itching, ear ache, nasal congestion, post nasal drip,  CV:  No-   chest pain, orthopnea, PND, swelling in lower extremities, anasarca, dizziness, palpitations Resp: No-   shortness of breath with exertion or at rest.              No-   productive cough,   No non-productive cough,  No-  coughing up of blood.              No-   change in color of mucus.  No- wheezing.   Skin: No-   rash or lesions. GI:  No-   heartburn, indigestion, abdominal pain, nausea, vomiting, GU: . MS:  No-   joint pain or swelling.  . Neuro- nothing unusual Psych:  No- change in mood or affect. No depression or anxiety.  No memory loss.  OBJ General- Alert, Oriented, Affect-appropriate, Distress- none acute Skin- rash-none, lesions- none, excoriation- none Lymphadenopathy- none Head- atraumatic            Eyes- Gross vision intact, PERRLA, conjunctivae clear            Ears- Hearing, canals-normal            Nose- Clear, no-Septal dev, mucus, polyps, erosion, perforation             Throat- Mallampati III , mucosa clear , drainage- none, tonsils- atrophic Neck- flexible , trachea midline, no stridor , thyroid nl, carotid no bruit Chest - symmetrical excursion , unlabored           Heart/CV- RRR , no murmur , no gallop  , no rub, nl s1 s2                           - JVD- none , edema- none, stasis changes- none, varices- none           Lung- clear to  P&A, wheeze- none, cough- none , dullness-none, rub- none           Chest wall-  Abd-  Br/ Gen/ Rectal- Not done, not indicated Extrem- cyanosis- none, clubbing, none, atrophy- none, strength- nl Neuro- grossly intact to observation

## 2014-02-22 ENCOUNTER — Encounter: Payer: Self-pay | Admitting: Internal Medicine

## 2014-02-22 NOTE — Assessment & Plan Note (Signed)
She has had no recent asthma and feels well controlled.

## 2014-02-22 NOTE — Assessment & Plan Note (Signed)
Appropriate to continue allergy vaccine 1:10 GO Discussed risk benefit Plan-refill EpiPen, continue vaccine

## 2014-05-15 ENCOUNTER — Ambulatory Visit (INDEPENDENT_AMBULATORY_CARE_PROVIDER_SITE_OTHER): Payer: Medicare Other

## 2014-05-15 DIAGNOSIS — J309 Allergic rhinitis, unspecified: Secondary | ICD-10-CM | POA: Diagnosis not present

## 2014-10-01 ENCOUNTER — Encounter: Payer: Self-pay | Admitting: Internal Medicine

## 2014-12-04 ENCOUNTER — Ambulatory Visit (INDEPENDENT_AMBULATORY_CARE_PROVIDER_SITE_OTHER): Payer: Medicare Other

## 2014-12-04 ENCOUNTER — Telehealth: Payer: Self-pay | Admitting: Internal Medicine

## 2014-12-04 DIAGNOSIS — J309 Allergic rhinitis, unspecified: Secondary | ICD-10-CM | POA: Diagnosis not present

## 2014-12-04 NOTE — Telephone Encounter (Signed)
Allergy Serum Extract Date Mixed: 12/04/14 Vial: 1 Strength: 1:10 Here/Mail/Pick Up: mail Mixed By: Laurette Schimke

## 2014-12-09 ENCOUNTER — Other Ambulatory Visit: Payer: Self-pay

## 2014-12-09 DIAGNOSIS — Z1231 Encounter for screening mammogram for malignant neoplasm of breast: Secondary | ICD-10-CM

## 2014-12-22 ENCOUNTER — Ambulatory Visit
Admission: RE | Admit: 2014-12-22 | Discharge: 2014-12-22 | Disposition: A | Discharge status: Home / Self Care | Payer: Medicare Other | Source: Ambulatory Visit

## 2014-12-22 DIAGNOSIS — Z1231 Encounter for screening mammogram for malignant neoplasm of breast: Secondary | ICD-10-CM

## 2015-02-23 ENCOUNTER — Encounter (INDEPENDENT_AMBULATORY_CARE_PROVIDER_SITE_OTHER): Payer: Self-pay

## 2015-02-23 ENCOUNTER — Encounter: Payer: Self-pay | Admitting: Internal Medicine

## 2015-02-23 ENCOUNTER — Ambulatory Visit (INDEPENDENT_AMBULATORY_CARE_PROVIDER_SITE_OTHER): Payer: Medicare Other | Admitting: Internal Medicine

## 2015-02-23 VITALS — BP 122/78 | HR 74 | Ht 66.5 in | Wt 185.0 lb

## 2015-02-23 DIAGNOSIS — J4520 Mild intermittent asthma, uncomplicated: Secondary | ICD-10-CM | POA: Diagnosis not present

## 2015-02-23 DIAGNOSIS — J302 Other seasonal allergic rhinitis: Secondary | ICD-10-CM

## 2015-02-23 DIAGNOSIS — J3089 Other allergic rhinitis: Secondary | ICD-10-CM

## 2015-02-23 DIAGNOSIS — J309 Allergic rhinitis, unspecified: Secondary | ICD-10-CM

## 2015-02-23 MED ORDER — ALBUTEROL SULFATE HFA 108 (90 BASE) MCG/ACT IN AERS: 2.0000 | INHALATION_SPRAY | RESPIRATORY_TRACT | Status: DC | PRN

## 2015-02-23 MED ORDER — EPINEPHRINE 0.3 MG/0.3ML IJ SOAJ: INTRAMUSCULAR | Status: DC

## 2015-02-23 NOTE — Progress Notes (Signed)
11/12/10-70 year old female never smoker followed for asthma, allergic rhinitis Last here 11/13/2009 She continues allergy vaccine without difficulties. Needs update on EpiPen. Overall this has been a good year and she has no concerns. Eyes burn and water at times. Pataday eyedrops are expensive, but she wears contact lenses and is limited in what she can use. We discussed some over-the-counter products which she can ask her eye doctor about. Has had one or 2 very mild episodes of chest tightness with little need for rescue inhaler. She does want to keep her rescue inhaler available. Asks flu vaccine.  11/14/11- 70 year old female never smoker followed for asthma, allergic rhinitis Slight cough from weather changes but not productive; still on allergy vaccine 1:10 GO and doing well. Has done well overall for the past year and satisfied with treatment. Has changed to an over-the-counter eyedrop.  12/25/12- 70 year old female never smoker followed for asthma, allergic rhinitis FOLLOWS FOR: still on allergy vaccine 1:10 GO and doing well; denies any flare ups. Uses rescue inhaler once or twice a year. Zaditor eyedrops when she is not wearing contact lenses.  02/20/14- 70 year old female never smoker followed for asthma, allergic rhinitis, allergic conjunctivitis Follows For: Denies sob, wheezing, cough ,runny nose or sneezing.  PT feeling well allergy vaccine 1:10 GO She considers the effect of allergy vaccine "fantastic" and wishes no changes  02/23/2015-70 year old female never smoker followed for asthma, allergic rhinitis, allergic conjunctivitis Allergy Vaccine 1:10 GO Follows for: Asthma/AR. Pt states that her asthms is doing well. Pt has used albuterol HFA 3-4 times this year. Pt states that her allergy symptoms are doing well.  She is satisfied to continue allergy vaccine. Describes this as a "very good year"  ROS- see HPI Constitutional:   No-   weight loss, night sweats, fevers, chills,  fatigue, lassitude. HEENT:   No-  headaches, difficulty swallowing, tooth/dental problems, sore throat,       No-  sneezing, itching, ear ache, nasal congestion, post nasal drip,  CV:  No-   chest pain, orthopnea, PND, swelling in lower extremities, anasarca, dizziness, palpitations Resp: No-   shortness of breath with exertion or at rest.              No-   productive cough,  No non-productive cough,  No-  coughing up of blood.              No-   change in color of mucus.  No- wheezing.   Skin: No-   rash or lesions. GI:  No-   heartburn, indigestion, abdominal pain, nausea, vomiting, GU: . MS:  No-   joint pain or swelling.  . Neuro- nothing unusual Psych:  No- change in mood or affect. No depression or anxiety.  No memory loss.  OBJ General- Alert, Oriented, Affect-appropriate, Distress- none acute Skin- rash-none, lesions- none, excoriation- none Lymphadenopathy- none Head- atraumatic            Eyes- Gross vision intact, PERRLA, conjunctivae clear            Ears- Hearing, canals-normal            Nose- Clear, no-Septal dev, mucus, polyps, erosion, perforation             Throat- Mallampati III , mucosa clear , drainage- none, tonsils- atrophic Neck- flexible , trachea midline, no stridor , thyroid nl, carotid no bruit Chest - symmetrical excursion , unlabored           Heart/CV- RRR , no murmur ,  no gallop  , no rub, nl s1 s2                           - JVD- none , edema- none, stasis changes- none, varices- none           Lung- clear to P&A, wheeze- none, cough- none , dullness-none, rub- none           Chest wall-  Abd-  Br/ Gen/ Rectal- Not done, not indicated Extrem- cyanosis- none, clubbing, none, atrophy- none, strength- nl Neuro- grossly intact to observation

## 2015-02-23 NOTE — Patient Instructions (Signed)
We can continue allergy vaccine another year   1:10 GO  Printed scripts refilling albuterol rescue inhaler and Epipen  Please call if we can help-

## 2015-03-22 NOTE — Assessment & Plan Note (Signed)
Satisfactory control. Medications reviewed

## 2015-03-22 NOTE — Assessment & Plan Note (Signed)
Okay to continue allergy vaccine another year. Refill EpiPen

## 2015-06-25 ENCOUNTER — Telehealth: Payer: Self-pay | Admitting: Internal Medicine

## 2015-06-25 DIAGNOSIS — J309 Allergic rhinitis, unspecified: Secondary | ICD-10-CM | POA: Diagnosis not present

## 2015-06-25 NOTE — Telephone Encounter (Signed)
Allergy Serum Extract Date Mixed: 06/25/15 Vial: 1 Strength: 1:10 Here/Mail/Pick Up: mail Mixed By: tbs Last OV: 02/23/15 Pending OV: 02/23/16

## 2015-12-14 ENCOUNTER — Other Ambulatory Visit: Payer: Self-pay | Admitting: Family Medicine

## 2015-12-14 DIAGNOSIS — Z1231 Encounter for screening mammogram for malignant neoplasm of breast: Secondary | ICD-10-CM

## 2016-01-20 ENCOUNTER — Ambulatory Visit
Admission: RE | Admit: 2016-01-20 | Discharge: 2016-01-20 | Disposition: A | Discharge status: Home / Self Care | Payer: Medicare Other | Source: Ambulatory Visit | Attending: Family Medicine | Admitting: Family Medicine

## 2016-01-20 DIAGNOSIS — Z1231 Encounter for screening mammogram for malignant neoplasm of breast: Secondary | ICD-10-CM

## 2016-02-02 DIAGNOSIS — J309 Allergic rhinitis, unspecified: Secondary | ICD-10-CM | POA: Diagnosis not present

## 2016-02-03 ENCOUNTER — Telehealth: Payer: Self-pay | Admitting: Internal Medicine

## 2016-02-03 NOTE — Telephone Encounter (Signed)
Allergy Serum Extract Date Mixed: 02/03/16 Vial: 1 Strength: 1:10 Here/Mail/Pick Up: mail Mixed By: tbs Last OV: 02/23/15 Pending OV: 02/23/16

## 2016-02-23 ENCOUNTER — Ambulatory Visit: Payer: Medicare Other | Admitting: Internal Medicine

## 2016-02-26 ENCOUNTER — Ambulatory Visit: Payer: Medicare Other | Admitting: Internal Medicine

## 2016-04-13 ENCOUNTER — Telehealth: Payer: Self-pay | Admitting: Internal Medicine

## 2016-04-13 DIAGNOSIS — J3089 Other allergic rhinitis: Principal | ICD-10-CM

## 2016-04-13 DIAGNOSIS — J302 Other seasonal allergic rhinitis: Secondary | ICD-10-CM

## 2016-04-13 NOTE — Telephone Encounter (Signed)
Spoke with pt. She is needing a referral to the Allergy and Asthma clinic. Referral has been placed. Nothing further was needed.

## 2016-06-02 ENCOUNTER — Encounter: Payer: Self-pay | Admitting: Allergy & Immunology

## 2016-06-02 ENCOUNTER — Encounter: Payer: Self-pay | Admitting: *Deleted

## 2016-06-02 ENCOUNTER — Ambulatory Visit (INDEPENDENT_AMBULATORY_CARE_PROVIDER_SITE_OTHER): Payer: Medicare Other | Admitting: Allergy & Immunology

## 2016-06-02 VITALS — BP 112/68 | HR 79 | Temp 98.4°F | Resp 16 | Ht 66.0 in | Wt 181.8 lb

## 2016-06-02 DIAGNOSIS — J301 Allergic rhinitis due to pollen: Secondary | ICD-10-CM

## 2016-06-02 DIAGNOSIS — J4520 Mild intermittent asthma, uncomplicated: Secondary | ICD-10-CM | POA: Diagnosis not present

## 2016-06-02 MED ORDER — EPINEPHRINE 0.3 MG/0.3ML IJ SOAJ: INTRAMUSCULAR | 1 refills | Status: DC

## 2016-06-02 MED ORDER — ALBUTEROL SULFATE HFA 108 (90 BASE) MCG/ACT IN AERS: 2.0000 | INHALATION_SPRAY | RESPIRATORY_TRACT | 1 refills | Status: DC | PRN

## 2016-06-02 NOTE — Progress Notes (Signed)
New start vials made

## 2016-06-02 NOTE — Patient Instructions (Addendum)
1. Mild intermittent asthma, uncomplicated - Lung testing today looked fairly normal.  - It does not seem that you need a controller medication at this time. - We will send in a prescription for albuterol 4 puffs every 4-6 hours as needed.  - If you are using your albuterol on a more frequent basis, we will start a controller medication in the future.   2. Chronic rhinitis - Testing today showed: positives to grasses, weeds, cat, and dust mite. - Cockroach was also slightly reactive, but this does not seem like a clinically significant allergen for you in your daily life. - We will mix allergy shots based on the testing today. - Make an appointment in two weeks for your first injection. - Allergy shot consent signed and expectations discussed. - We will refill your EpiPen today. - In the meantime, continue with Nasacort 1-2 sprays per nostril 1-2 times daily as needed. - Consider the use of an antihistamine as needed for the worst symptoms.  3. Return in about 3 months (around 09/01/2016).  Please inform us of any Emergency Department visits, hospitalizations, or changes in symptoms. Call us before going to the ED for breathing or allergy symptoms since we might be able to fit you in for a sick visit. Feel free to contact us anytime with any questions, problems, or concerns.  It was a pleasure to meet you today! Happy spring!   Websites that have reliable patient information: 1. American Academy of Asthma, Allergy, and Immunology: www.aaaai.org 2. Food Allergy Research and Education (FARE): foodallergy.org 3. Mothers of Asthmatics: http://www.asthmacommunitynetwork.org 4. American College of Allergy, Asthma, and Immunology: www.acaai.org  Reducing Pollen Exposure  The American Academy of Allergy, Asthma and Immunology suggests the following steps to reduce your exposure to pollen during allergy seasons.    1. Do not hang sheets or clothing out to dry; pollen may collect on these  items. 2. Do not mow lawns or spend time around freshly cut grass; mowing stirs up pollen. 3. Keep windows closed at night.  Keep car windows closed while driving. 4. Minimize morning activities outdoors, a time when pollen counts are usually at their highest. 5. Stay indoors as much as possible when pollen counts or humidity is high and on windy days when pollen tends to remain in the air longer. 6. Use air conditioning when possible.  Many air conditioners have filters that trap the pollen spores. 7. Use a HEPA room air filter to remove pollen form the indoor air you breathe.  Control of House Dust Mite Allergen    House dust mites play a major role in allergic asthma and rhinitis.  They occur in environments with high humidity wherever human skin, the food for dust mites is found. High levels have been detected in dust obtained from mattresses, pillows, carpets, upholstered furniture, bed covers, clothes and soft toys.  The principal allergen of the house dust mite is found in its feces.  A gram of dust may contain 1,000 mites and 250,000 fecal particles.  Mite antigen is easily measured in the air during house cleaning activities.    1. Encase mattresses, including the box spring, and pillow, in an air tight cover.  Seal the zipper end of the encased mattresses with wide adhesive tape. 2. Wash the bedding in water of 130 degrees Farenheit weekly.  Avoid cotton comforters/quilts and flannel bedding: the most ideal bed covering is the dacron comforter. 3. Remove all upholstered furniture from the bedroom. 4. Remove carpets, carpet padding,  rugs, and non-washable window drapes from the bedroom.  Wash drapes weekly or use plastic window coverings. 5. Remove all non-washable stuffed toys from the bedroom.  Wash stuffed toys weekly. 6. Have the room cleaned frequently with a vacuum cleaner and a damp dust-mop.  The patient should not be in a room which is being cleaned and should wait 1 hour after  cleaning before going into the room. 7. Close and seal all heating outlets in the bedroom.  Otherwise, the room will become filled with dust-laden air.  An electric heater can be used to heat the room. 8. Reduce indoor humidity to less than 50%.  Do not use a humidifier.  Control of Dog or Cat Allergen  Avoidance is the best way to manage a dog or cat allergy. If you have a dog or cat and are allergic to dog or cats, consider removing the dog or cat from the home. If you have a dog or cat but don't want to find it a new home, or if your family wants a pet even though someone in the household is allergic, here are some strategies that may help keep symptoms at bay:  1. Keep the pet out of your bedroom and restrict it to only a few rooms. Be advised that keeping the dog or cat in only one room will not limit the allergens to that room. 2. Don't pet, hug or kiss the dog or cat; if you do, wash your hands with soap and water. 3. High-efficiency particulate air (HEPA) cleaners run continuously in a bedroom or living room can reduce allergen levels over time. 4. Regular use of a high-efficiency vacuum cleaner or a central vacuum can reduce allergen levels. 5. Giving your dog or cat a bath at least once a week can reduce airborne allergen.

## 2016-06-02 NOTE — Progress Notes (Addendum)
NEW PATIENT  Date of Service/Encounter:  06/02/16  Referring provider: Tawanna Solo, MD   Assessment:   Mild intermittent asthma, uncomplicated - Plan: Spirometry with Graph  Seasonal allergic rhinitis due to pollen - Plan: Allergy Test, Interdermal Allergy Test, Allergen Immunotherapy   Asthma Reportables:  Severity: intermittent  Risk: low Control: well controlled   Plan/Recommendations:   1. Mild intermittent asthma, uncomplicated - Lung testing today looked fairly normal.  - It does not seem that you need a controller medication at this time. - We will send in a prescription for albuterol 4 puffs every 4-6 hours as needed.  - If you are using your albuterol on a more frequent basis, we will start a controller medication in the future.   2. Chronic rhinitis - Testing today showed: positives to grasses, weeds, cat, and dust mite. - Avoidance measures provided.  - Cockroach was also slightly reactive, but this does not seem like a clinically significant allergen for Ms. Leon in her daily life. - We will mix allergy shots based on the testing today. - Make an appointment in two weeks for your first injection. - Allergy shot consent signed and expectations discussed. - We will refill your EpiPen today. - In the meantime, continue with Nasacort 1-2 sprays per nostril 1-2 times daily as needed. - Consider the use of an antihistamine as needed for the worst symptoms.  3. Return in about 3 months (around 09/01/2016).   Subjective:   [name] [name] is a 71 y.o. female presenting today for evaluation of  Chief Complaint  Patient presents with  . Allergies  . Asthma    [name] has a history of the following: Patient Active Problem List   Diagnosis Date Noted  . ALLERGIC CONJUNCTIVITIS 09/26/2007  . Seasonal and perennial allergic rhinitis 09/26/2007  . Asthma, mild intermittent, well-controlled 09/26/2007    History obtained from: chart review and  patient.  [name] [name] was referred by Tawanna Solo, MD.     [name] is a 71 y.o. female presenting for an allergy evaluation. She is a patient of Dr. Janee Morn and has been on allergy injections for quite some time. She is actually getting them at home which she has been doing for more than ten years. She has never had a reaction and does have an EpiPen, although it is not up to date.    Asthma/Respiratory Symptom History: Ms.,[name] was not diagnosed with asthma until 15 years ago around the time that started seeing Dr. Annamaria Boots. This was well after starting on the allergy shots. She has not needed prednisone in over three years. [name]'s asthma has been well controlled. She has not required rescue medication, experienced nocturnal awakenings due to lower respiratory symptoms, nor have activities of daily living been limited. She will have night time coughing during the pollen seasons. This is specially prominent when she goes to bed at first. Currently she does have a rescue inhaler but she needs a refill. She estimates that she uses it less than once per month.  Allergic Rhinitis Symptom History: [name] [name] first developed allergies when she was in er mid 49s. She was tested at that time and has been under good control. The last time that she was tested two years ago at Dr. Janee Morn office. She does gets one injection weekly, which she gives herself at home. This has been going on for around 9 years. When she misses her week, she will start to get some congestion. At a  shot every two weeks, she does start to feel bad. She is still changing her lifestyle, such as wearing a mask when she is in the attic. She does not avoid going outside but instead just does not like going outside. She is on Nasacort only during the spring time. She does not use any antihistamines.   Food Allergy Symptom History: There are no concerns with food allergies. She tolerates all of the major gfood allergens without a problem.    Otherwise, there is no history of other atopic diseases, including drug allergies, stinging insect allergies, or urticaria. There is no significant infectious history. She has not had antibiotics in 3-4 years for anything at all. Vaccinations are up to date.    Past Medical History: Patient Active Problem List   Diagnosis Date Noted  . ALLERGIC CONJUNCTIVITIS 09/26/2007  . Seasonal and perennial allergic rhinitis 09/26/2007  . Asthma, mild intermittent, well-controlled 09/26/2007    Medication List:  Allergies as of 06/02/2016   No Known Allergies     Medication List       Accurate as of 06/02/16 10:38 AM. Always use your most recent med list.          albuterol 108 (90 Base) MCG/ACT inhaler Commonly known as:  PROVENTIL HFA;VENTOLIN HFA Inhale 2 puffs into the lungs every 4 (four) hours as needed for wheezing or shortness of breath.   aspirin 81 MG tablet Take 81 mg by mouth daily.   atorvastatin 10 MG tablet Commonly known as:  LIPITOR Take 10 mg by mouth daily.   B-12 5000 MCG Tbdp Take 1 tablet by mouth daily.   Biotin 10 MG Tabs Take 1 tablet by mouth 4 (four) times daily.   CALCIUM 500 + D PO Take 1 tablet by mouth 2 (two) times daily.   EPINEPHrine 0.3 mg/0.3 mL Soaj injection Commonly known as:  EPI-PEN Inject into thigh for severe allergic reaction   hydrOXYzine 25 MG capsule Commonly known as:  VISTARIL Take 1 capsule by mouth daily.   lisinopril 5 MG tablet Commonly known as:  PRINIVIL,ZESTRIL Take 5 mg by mouth daily.   Magnesium 250 MG Tabs Take 1 tablet by mouth 2 (two) times daily.   multivitamin capsule Take 1 capsule by mouth daily.   NASACORT AQ NA Place 2 puffs into the nose daily.   NONFORMULARY OR COMPOUNDED ITEM Allergy Vaccine 1:10 Given at [place]       Birth History: non-contributory.   Developmental History: non-contributory.   Past Surgical History: Past Surgical History:   Procedure Laterality Date  . FOOT SURGERY    . TONSILLECTOMY       Family History: Family History  Problem Relation Age of Onset  . Allergies Mother   . Asthma Mother   . Heart disease Mother   . Arthritis Mother   . Breast cancer Mother   . Diabetes Father   . Allergies Father   . Heart disease Father   . Allergic rhinitis Neg Hx   . Angioedema Neg Hx   . Eczema Neg Hx   . Immunodeficiency Neg Hx   . Urticaria Neg Hx      Social History: Tateanna lives at home with her husband. They have been married 50 years on June 1st of this year. There are no animals at home. They had two animals ten years ago but they have since been put to sleep. They have two children who live in the  area as well as two grand children. 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 They live in an 69 year old home. There is wood throughout the home. They have gas heating and central cooling. There are no animals inside or outside of the home. She does have dust mite coverings on her bedding. There is no tobacco exposure.    Review of Systems: a 14-point review of systems is pertinent for what is mentioned in HPI.  Otherwise, all other systems were negative. Constitutional: negative other than that listed in the HPI Eyes: negative other than that listed in the HPI Ears, nose, mouth, throat, and face: negative other than that listed in the HPI Respiratory: negative other than that listed in the HPI Cardiovascular: negative other than that listed in the HPI Gastrointestinal: negative other than that listed in the HPI Genitourinary: negative other than that listed in the HPI Integument: negative other than that listed in the HPI Hematologic: negative other than that listed in the HPI Musculoskeletal: negative other than that listed in the HPI Neurological: negative other than that listed in the HPI Allergy/Immunologic: negative other than that listed in the  HPI    Objective:   Blood pressure 112/68, pulse 79, temperature 98.4 F (36.9 C), temperature source Oral, resp. rate 16, height 5\' 6"  (1.676 m), weight 181 lb 12.8 oz (82.5 kg), SpO2 96 %. Body mass index is 29.34 kg/m.   Physical Exam:  General: Alert, interactive, in no acute distress. Pleasant female.  Eyes: No conjunctival injection present on the right, No conjunctival injection present on the left, PERRL bilaterally, No discharge on the right, No discharge on the left and No Horner-Trantas dots present Ears: Right TM pearly gray with normal light reflex, Left TM pearly gray with normal light reflex, Right TM intact without perforation and Left TM intact without perforation.  Nose/Throat: External nose within normal limits, nasal crease present and septum midline, turbinates edematous and pale with clear discharge, post-pharynx erythematous without cobblestoning in the posterior oropharynx. Tonsils 2+ without exudates Neck: Supple without thyromegaly.  Adenopathy: no enlarged lymph nodes appreciated in the anterior cervical, occipital, axillary, epitrochlear, inguinal, or popliteal regions Lungs: Clear to auscultation without wheezing, rhonchi or rales. No increased work of breathing. CV: Normal S1/S2, no murmurs. Capillary refill <2 seconds.  Abdomen: Nondistended, nontender. No guarding or rebound tenderness. Bowel sounds faint and present in all fields  Skin: Warm and dry, without lesions or rashes. Extremities:  No clubbing, cyanosis or edema. Neuro:   Grossly intact. No focal deficits appreciated. Responsive to questions.  Diagnostic studies:  Spirometry: results normal (FEV1: 2.91/91%, FVC: 2.21/72%, FEV1/FVC: 99%).    Spirometry consistent with possible restrictive disease. Technique was less than ideal but symptomatically she seemed stable.   Allergy Studies:   Indoor/Outdoor Percutaneous Adult Environmental Panel: negative to the entire panel with adequate  controls.  Indoor/Outdoor Selected Intradermal Environmental Panel: positive to [employer], Grass mix, weed mix, cat and mite mix. There was an equivocal reaction to cockroach. Otherwise negative with adequate controls.      Salvatore Marvel, MD [facility]

## 2016-06-03 DIAGNOSIS — J301 Allergic rhinitis due to pollen: Secondary | ICD-10-CM | POA: Diagnosis not present

## 2016-06-20 ENCOUNTER — Ambulatory Visit (INDEPENDENT_AMBULATORY_CARE_PROVIDER_SITE_OTHER): Payer: Medicare Other

## 2016-06-20 ENCOUNTER — Ambulatory Visit: Payer: Medicare Other

## 2016-06-20 DIAGNOSIS — J309 Allergic rhinitis, unspecified: Secondary | ICD-10-CM

## 2016-06-27 ENCOUNTER — Ambulatory Visit (INDEPENDENT_AMBULATORY_CARE_PROVIDER_SITE_OTHER): Payer: Medicare Other | Admitting: *Deleted

## 2016-06-27 DIAGNOSIS — J309 Allergic rhinitis, unspecified: Secondary | ICD-10-CM

## 2016-07-01 ENCOUNTER — Ambulatory Visit (INDEPENDENT_AMBULATORY_CARE_PROVIDER_SITE_OTHER): Payer: Medicare Other

## 2016-07-01 DIAGNOSIS — J309 Allergic rhinitis, unspecified: Secondary | ICD-10-CM

## 2016-07-06 ENCOUNTER — Ambulatory Visit (INDEPENDENT_AMBULATORY_CARE_PROVIDER_SITE_OTHER): Payer: Medicare Other | Admitting: *Deleted

## 2016-07-06 DIAGNOSIS — J309 Allergic rhinitis, unspecified: Secondary | ICD-10-CM

## 2016-07-08 ENCOUNTER — Ambulatory Visit (INDEPENDENT_AMBULATORY_CARE_PROVIDER_SITE_OTHER): Payer: Medicare Other

## 2016-07-08 DIAGNOSIS — J309 Allergic rhinitis, unspecified: Secondary | ICD-10-CM

## 2016-07-12 ENCOUNTER — Ambulatory Visit (INDEPENDENT_AMBULATORY_CARE_PROVIDER_SITE_OTHER): Payer: Medicare Other | Admitting: *Deleted

## 2016-07-12 DIAGNOSIS — J309 Allergic rhinitis, unspecified: Secondary | ICD-10-CM | POA: Diagnosis not present

## 2016-07-15 ENCOUNTER — Ambulatory Visit (INDEPENDENT_AMBULATORY_CARE_PROVIDER_SITE_OTHER): Payer: Medicare Other | Admitting: *Deleted

## 2016-07-15 DIAGNOSIS — J309 Allergic rhinitis, unspecified: Secondary | ICD-10-CM

## 2016-07-18 ENCOUNTER — Ambulatory Visit (INDEPENDENT_AMBULATORY_CARE_PROVIDER_SITE_OTHER): Payer: Medicare Other

## 2016-07-18 DIAGNOSIS — J309 Allergic rhinitis, unspecified: Secondary | ICD-10-CM | POA: Diagnosis not present

## 2016-07-21 ENCOUNTER — Ambulatory Visit (INDEPENDENT_AMBULATORY_CARE_PROVIDER_SITE_OTHER): Payer: Medicare Other | Admitting: *Deleted

## 2016-07-21 DIAGNOSIS — J309 Allergic rhinitis, unspecified: Secondary | ICD-10-CM | POA: Diagnosis not present

## 2016-07-26 ENCOUNTER — Ambulatory Visit (INDEPENDENT_AMBULATORY_CARE_PROVIDER_SITE_OTHER): Payer: Medicare Other | Admitting: *Deleted

## 2016-07-26 DIAGNOSIS — J309 Allergic rhinitis, unspecified: Secondary | ICD-10-CM

## 2016-07-28 ENCOUNTER — Ambulatory Visit (INDEPENDENT_AMBULATORY_CARE_PROVIDER_SITE_OTHER): Payer: Medicare Other

## 2016-07-28 DIAGNOSIS — J309 Allergic rhinitis, unspecified: Secondary | ICD-10-CM

## 2016-08-02 ENCOUNTER — Ambulatory Visit (INDEPENDENT_AMBULATORY_CARE_PROVIDER_SITE_OTHER): Payer: Medicare Other | Admitting: *Deleted

## 2016-08-02 DIAGNOSIS — J309 Allergic rhinitis, unspecified: Secondary | ICD-10-CM | POA: Diagnosis not present

## 2016-08-08 ENCOUNTER — Ambulatory Visit (INDEPENDENT_AMBULATORY_CARE_PROVIDER_SITE_OTHER): Payer: Medicare Other

## 2016-08-08 DIAGNOSIS — J309 Allergic rhinitis, unspecified: Secondary | ICD-10-CM

## 2016-08-11 ENCOUNTER — Ambulatory Visit (INDEPENDENT_AMBULATORY_CARE_PROVIDER_SITE_OTHER): Payer: Medicare Other | Admitting: *Deleted

## 2016-08-11 DIAGNOSIS — J309 Allergic rhinitis, unspecified: Secondary | ICD-10-CM

## 2016-08-15 ENCOUNTER — Ambulatory Visit (INDEPENDENT_AMBULATORY_CARE_PROVIDER_SITE_OTHER): Payer: Medicare Other

## 2016-08-15 DIAGNOSIS — J309 Allergic rhinitis, unspecified: Secondary | ICD-10-CM | POA: Diagnosis not present

## 2016-08-18 ENCOUNTER — Ambulatory Visit (INDEPENDENT_AMBULATORY_CARE_PROVIDER_SITE_OTHER): Payer: Medicare Other

## 2016-08-18 DIAGNOSIS — J309 Allergic rhinitis, unspecified: Secondary | ICD-10-CM | POA: Diagnosis not present

## 2016-08-22 ENCOUNTER — Ambulatory Visit (INDEPENDENT_AMBULATORY_CARE_PROVIDER_SITE_OTHER): Payer: Medicare Other | Admitting: *Deleted

## 2016-08-22 DIAGNOSIS — J309 Allergic rhinitis, unspecified: Secondary | ICD-10-CM

## 2016-08-25 ENCOUNTER — Ambulatory Visit (INDEPENDENT_AMBULATORY_CARE_PROVIDER_SITE_OTHER): Payer: Medicare Other | Admitting: *Deleted

## 2016-08-25 DIAGNOSIS — J309 Allergic rhinitis, unspecified: Secondary | ICD-10-CM | POA: Diagnosis not present

## 2016-08-31 ENCOUNTER — Ambulatory Visit (INDEPENDENT_AMBULATORY_CARE_PROVIDER_SITE_OTHER): Payer: Medicare Other

## 2016-08-31 DIAGNOSIS — J309 Allergic rhinitis, unspecified: Secondary | ICD-10-CM

## 2016-09-05 ENCOUNTER — Ambulatory Visit: Payer: Medicare Other | Admitting: Allergy & Immunology

## 2016-09-07 ENCOUNTER — Ambulatory Visit (INDEPENDENT_AMBULATORY_CARE_PROVIDER_SITE_OTHER): Payer: Medicare Other

## 2016-09-07 DIAGNOSIS — J309 Allergic rhinitis, unspecified: Secondary | ICD-10-CM | POA: Diagnosis not present

## 2016-09-16 ENCOUNTER — Ambulatory Visit (INDEPENDENT_AMBULATORY_CARE_PROVIDER_SITE_OTHER): Payer: Medicare Other | Admitting: *Deleted

## 2016-09-16 DIAGNOSIS — J309 Allergic rhinitis, unspecified: Secondary | ICD-10-CM | POA: Diagnosis not present

## 2016-09-19 ENCOUNTER — Ambulatory Visit: Payer: Self-pay

## 2016-09-19 ENCOUNTER — Ambulatory Visit (INDEPENDENT_AMBULATORY_CARE_PROVIDER_SITE_OTHER): Payer: Medicare Other | Admitting: Allergy & Immunology

## 2016-09-19 ENCOUNTER — Encounter: Payer: Self-pay | Admitting: Allergy & Immunology

## 2016-09-19 ENCOUNTER — Other Ambulatory Visit: Payer: Self-pay | Admitting: *Deleted

## 2016-09-19 VITALS — BP 112/66 | HR 88 | Resp 20

## 2016-09-19 DIAGNOSIS — J4520 Mild intermittent asthma, uncomplicated: Secondary | ICD-10-CM

## 2016-09-19 DIAGNOSIS — J3089 Other allergic rhinitis: Secondary | ICD-10-CM

## 2016-09-19 DIAGNOSIS — J302 Other seasonal allergic rhinitis: Secondary | ICD-10-CM

## 2016-09-19 DIAGNOSIS — J309 Allergic rhinitis, unspecified: Secondary | ICD-10-CM

## 2016-09-19 NOTE — Progress Notes (Signed)
FOLLOW UP  Date of Service/Encounter:  09/19/16   Assessment:   Mild intermittent asthma, uncomplicated  Seasonal and perennial allergic rhinitis (grasses, weeds, cat, and dust mite)  Asthma Reportables:  Severity: intermittent  Risk: low Control: well controlled    Plan/Recommendations:   1. Mild intermittent asthma, uncomplicated - Lung testing was normal today.  - Continue with ProAir four puffs at needed.   2. Chronic rhinitis (grasses, weeds, cat, and dust mite) - Continue with allergy shots at the same schedule.  - Continue with Nasacort 1-2 sprays per nostril 1-2 times daily as needed. - Consider the use of an antihistamine as needed for the worst symptoms.  3. Return in about 1 year (around 09/19/2017).   Subjective:   [name] [name] is a 71 y.o. female presenting today for follow up of  Chief Complaint  Patient presents with  . Asthma    doing good    [name] has a history of the following: Patient Active Problem List   Diagnosis Date Noted  . ALLERGIC CONJUNCTIVITIS 09/26/2007  . Seasonal and perennial allergic rhinitis 09/26/2007  . Asthma, mild intermittent, well-controlled 09/26/2007    History obtained from: chart review and patient.  [name] [name] Primary Care Provider is [name] Lass, MD.     [name] [name] is a 71 y.o. female presenting for a follow up visit. She was last seen April 2018 at which time she had testing that was positive to grasses, weeds, cat, and dust mite. We diagnosed her with asthma and started her on ProAir as needed. We started her on allergy shots as well as Nasacaort 1-2 sprays per nostril daily as needed. She was previously followed by Dr. Annamaria Boots.   Since the last visit, she has done well. She did have some headaches shortly after the last visit before starting shots, but by the third shot her headaches improved. She is on her nasal sprays, but only uses these as needed at this point. She has not needed her  antihistamines in quite some time. She has had no large local reactions or any reactions limiting her advanced.  [name] [name] is on allergen immunotherapy. She receives one injection. Immunotherapy script #1 contains weeds, grasses, dust mites and cat. She currently receives 0.27mL of the RED vial (1/100). She started shots May of 2018 and reached maintenance in July of 2018.   Otherwise, there have been no changes to her past medical history, surgical history, family history, or social history. Her husband has recently completed his second stem cell transplant for lymphoma, and he was discharged from one week ago. He has completed his chemotherapy as well.    Review of Systems: a 14-point review of systems is pertinent for what is mentioned in HPI.  Otherwise, all other systems were negative. Constitutional: negative other than that listed in the HPI Eyes: negative other than that listed in the HPI Ears, nose, mouth, throat, and face: negative other than that listed in the HPI Respiratory: negative other than that listed in the HPI Cardiovascular: negative other than that listed in the HPI Gastrointestinal: negative other than that listed in the HPI Genitourinary: negative other than that listed in the HPI Integument: negative other than that listed in the HPI Hematologic: negative other than that listed in the HPI Musculoskeletal: negative other than that listed in the HPI Neurological: negative other than that listed in the HPI Allergy/Immunologic: negative other than that listed in the HPI    Objective:   Blood pressure  112/66, pulse 88, resp. rate 20. There is no height or weight on file to calculate BMI.   Physical Exam:  General: Alert, interactive, in no acute distress. Smiling pleasant female. Eyes: No conjunctival injection present on the right, No conjunctival injection present on the left, PERRL bilaterally, No discharge on the right, No discharge on the left and No  Horner-Trantas dots present Ears: Right TM pearly gray with normal light reflex, Left TM pearly gray with normal light reflex, Right TM intact without perforation and Left TM intact without perforation.  Nose/Throat: External nose within normal limits and septum midline, turbinates edematous and pale with clear discharge, post-pharynx mildly erythematous without cobblestoning in the posterior oropharynx. Tonsils 2+ without exudates Neck: Supple without thyromegaly. Lungs: Clear to auscultation without wheezing, rhonchi or rales. No increased work of breathing. CV: Normal S1/S2, no murmurs. Capillary refill <2 seconds.  Skin: Warm and dry, without lesions or rashes. Neuro:   Grossly intact. No focal deficits appreciated. Responsive to questions.   Diagnostic studies:   Spirometry: Normal FEV1, FVC, and FEV1/FVC ratio. There is no scooping suggestive of obstructive disease. The FEV1 is 2.42 L (77%), the FVC is 3.09 L (65%), and the FEV1 to FVC ratio is 77%.      [name] Marvel, MD [facility] [place]

## 2016-09-19 NOTE — Patient Instructions (Addendum)
1. Mild intermittent asthma, uncomplicated - Lung testing was normal today.  - Continue with ProAir four puffs at needed.   2. Chronic rhinitis (grasses, weeds, cat, and dust mite) - Continue with allergy shots at the same schedule.  - Continue with Nasacort 1-2 sprays per nostril 1-2 times daily as needed. - Consider the use of an antihistamine as needed for the worst symptoms.  3. Return in about 1 year (around 09/19/2017).  Please inform us of any Emergency Department visits, hospitalizations, or changes in symptoms. Call us before going to the ED for breathing or allergy symptoms since we might be able to fit you in for a sick visit. Feel free to contact us anytime with any questions, problems, or concerns.  It was a pleasure to see you again today! Enjoy the rest of the summer!   Websites that have reliable patient information: 1. American Academy of Asthma, Allergy, and Immunology: www.aaaai.org 2. Food Allergy Research and Education (FARE): foodallergy.org 3. Mothers of Asthmatics: http://www.asthmacommunitynetwork.org 4. American College of Allergy, Asthma, and Immunology: www.acaai.org

## 2016-09-28 ENCOUNTER — Ambulatory Visit (INDEPENDENT_AMBULATORY_CARE_PROVIDER_SITE_OTHER): Payer: Medicare Other | Admitting: *Deleted

## 2016-09-28 DIAGNOSIS — J309 Allergic rhinitis, unspecified: Secondary | ICD-10-CM

## 2016-10-11 ENCOUNTER — Ambulatory Visit (INDEPENDENT_AMBULATORY_CARE_PROVIDER_SITE_OTHER): Payer: Medicare Other

## 2016-10-11 DIAGNOSIS — J309 Allergic rhinitis, unspecified: Secondary | ICD-10-CM | POA: Diagnosis not present

## 2016-10-19 ENCOUNTER — Ambulatory Visit (INDEPENDENT_AMBULATORY_CARE_PROVIDER_SITE_OTHER): Payer: Medicare Other

## 2016-10-19 DIAGNOSIS — J309 Allergic rhinitis, unspecified: Secondary | ICD-10-CM

## 2016-10-25 ENCOUNTER — Ambulatory Visit (INDEPENDENT_AMBULATORY_CARE_PROVIDER_SITE_OTHER): Payer: Medicare Other | Admitting: *Deleted

## 2016-10-25 DIAGNOSIS — J309 Allergic rhinitis, unspecified: Secondary | ICD-10-CM | POA: Diagnosis not present

## 2016-10-28 DIAGNOSIS — J301 Allergic rhinitis due to pollen: Secondary | ICD-10-CM | POA: Diagnosis not present

## 2016-11-02 ENCOUNTER — Ambulatory Visit (INDEPENDENT_AMBULATORY_CARE_PROVIDER_SITE_OTHER): Payer: Medicare Other | Admitting: *Deleted

## 2016-11-02 DIAGNOSIS — J309 Allergic rhinitis, unspecified: Secondary | ICD-10-CM | POA: Diagnosis not present

## 2016-11-11 ENCOUNTER — Ambulatory Visit (INDEPENDENT_AMBULATORY_CARE_PROVIDER_SITE_OTHER): Payer: Medicare Other

## 2016-11-11 DIAGNOSIS — J309 Allergic rhinitis, unspecified: Secondary | ICD-10-CM | POA: Diagnosis not present

## 2016-11-22 ENCOUNTER — Ambulatory Visit (INDEPENDENT_AMBULATORY_CARE_PROVIDER_SITE_OTHER): Payer: Medicare Other | Admitting: *Deleted

## 2016-11-22 DIAGNOSIS — J309 Allergic rhinitis, unspecified: Secondary | ICD-10-CM

## 2016-12-08 ENCOUNTER — Ambulatory Visit (INDEPENDENT_AMBULATORY_CARE_PROVIDER_SITE_OTHER): Payer: Medicare Other

## 2016-12-08 DIAGNOSIS — J309 Allergic rhinitis, unspecified: Secondary | ICD-10-CM

## 2016-12-15 ENCOUNTER — Ambulatory Visit (INDEPENDENT_AMBULATORY_CARE_PROVIDER_SITE_OTHER): Payer: Medicare Other | Admitting: *Deleted

## 2016-12-15 DIAGNOSIS — J309 Allergic rhinitis, unspecified: Secondary | ICD-10-CM

## 2016-12-23 ENCOUNTER — Other Ambulatory Visit: Payer: Self-pay | Admitting: Family Medicine

## 2016-12-23 ENCOUNTER — Ambulatory Visit (INDEPENDENT_AMBULATORY_CARE_PROVIDER_SITE_OTHER): Payer: Medicare Other

## 2016-12-23 DIAGNOSIS — Z1231 Encounter for screening mammogram for malignant neoplasm of breast: Secondary | ICD-10-CM

## 2016-12-23 DIAGNOSIS — J309 Allergic rhinitis, unspecified: Secondary | ICD-10-CM

## 2016-12-27 ENCOUNTER — Ambulatory Visit (INDEPENDENT_AMBULATORY_CARE_PROVIDER_SITE_OTHER): Payer: Medicare Other | Admitting: *Deleted

## 2016-12-27 DIAGNOSIS — J309 Allergic rhinitis, unspecified: Secondary | ICD-10-CM | POA: Diagnosis not present

## 2017-01-04 ENCOUNTER — Ambulatory Visit (INDEPENDENT_AMBULATORY_CARE_PROVIDER_SITE_OTHER): Payer: Medicare Other

## 2017-01-04 DIAGNOSIS — J309 Allergic rhinitis, unspecified: Secondary | ICD-10-CM

## 2017-01-13 ENCOUNTER — Ambulatory Visit (INDEPENDENT_AMBULATORY_CARE_PROVIDER_SITE_OTHER): Payer: Medicare Other

## 2017-01-13 DIAGNOSIS — J309 Allergic rhinitis, unspecified: Secondary | ICD-10-CM

## 2017-01-23 ENCOUNTER — Ambulatory Visit
Admission: RE | Admit: 2017-01-23 | Discharge: 2017-01-23 | Disposition: A | Discharge status: Home / Self Care | Payer: Medicare Other | Source: Ambulatory Visit | Attending: Family Medicine | Admitting: Family Medicine

## 2017-01-23 ENCOUNTER — Ambulatory Visit (INDEPENDENT_AMBULATORY_CARE_PROVIDER_SITE_OTHER): Payer: Medicare Other | Admitting: Allergy & Immunology

## 2017-01-23 ENCOUNTER — Encounter: Payer: Self-pay | Admitting: Radiology

## 2017-01-23 DIAGNOSIS — Z1231 Encounter for screening mammogram for malignant neoplasm of breast: Secondary | ICD-10-CM

## 2017-01-23 DIAGNOSIS — J309 Allergic rhinitis, unspecified: Secondary | ICD-10-CM

## 2017-02-01 ENCOUNTER — Ambulatory Visit (INDEPENDENT_AMBULATORY_CARE_PROVIDER_SITE_OTHER): Payer: Medicare Other

## 2017-02-01 DIAGNOSIS — J309 Allergic rhinitis, unspecified: Secondary | ICD-10-CM | POA: Diagnosis not present

## 2017-02-07 HISTORY — PX: INSERTION / PLACEMENT / REVISION NEUROSTIMULATOR: SUR720

## 2017-02-09 ENCOUNTER — Ambulatory Visit (INDEPENDENT_AMBULATORY_CARE_PROVIDER_SITE_OTHER): Payer: Medicare Other

## 2017-02-09 DIAGNOSIS — J309 Allergic rhinitis, unspecified: Secondary | ICD-10-CM | POA: Diagnosis not present

## 2017-02-10 ENCOUNTER — Encounter: Payer: Self-pay | Admitting: *Deleted

## 2017-02-10 NOTE — Progress Notes (Signed)
Vial made. Exp: 02-10-18. hv

## 2017-02-15 ENCOUNTER — Ambulatory Visit: Payer: Medicare Other

## 2017-02-16 ENCOUNTER — Ambulatory Visit (INDEPENDENT_AMBULATORY_CARE_PROVIDER_SITE_OTHER): Payer: Medicare Other | Admitting: *Deleted

## 2017-02-16 DIAGNOSIS — J309 Allergic rhinitis, unspecified: Secondary | ICD-10-CM

## 2017-02-17 DIAGNOSIS — J301 Allergic rhinitis due to pollen: Secondary | ICD-10-CM | POA: Diagnosis not present

## 2017-02-23 ENCOUNTER — Ambulatory Visit (INDEPENDENT_AMBULATORY_CARE_PROVIDER_SITE_OTHER): Payer: Medicare Other

## 2017-02-23 DIAGNOSIS — J309 Allergic rhinitis, unspecified: Secondary | ICD-10-CM

## 2017-03-02 ENCOUNTER — Ambulatory Visit (INDEPENDENT_AMBULATORY_CARE_PROVIDER_SITE_OTHER): Payer: Medicare Other | Admitting: *Deleted

## 2017-03-02 DIAGNOSIS — J309 Allergic rhinitis, unspecified: Secondary | ICD-10-CM | POA: Diagnosis not present

## 2017-03-09 ENCOUNTER — Ambulatory Visit (INDEPENDENT_AMBULATORY_CARE_PROVIDER_SITE_OTHER): Payer: Medicare Other | Admitting: *Deleted

## 2017-03-09 DIAGNOSIS — J309 Allergic rhinitis, unspecified: Secondary | ICD-10-CM

## 2017-03-15 ENCOUNTER — Ambulatory Visit (INDEPENDENT_AMBULATORY_CARE_PROVIDER_SITE_OTHER): Payer: Medicare Other

## 2017-03-15 DIAGNOSIS — J309 Allergic rhinitis, unspecified: Secondary | ICD-10-CM | POA: Diagnosis not present

## 2017-03-29 ENCOUNTER — Ambulatory Visit (INDEPENDENT_AMBULATORY_CARE_PROVIDER_SITE_OTHER): Payer: Medicare Other | Admitting: *Deleted

## 2017-03-29 DIAGNOSIS — J309 Allergic rhinitis, unspecified: Secondary | ICD-10-CM

## 2017-04-12 ENCOUNTER — Ambulatory Visit (INDEPENDENT_AMBULATORY_CARE_PROVIDER_SITE_OTHER): Payer: Medicare Other | Admitting: *Deleted

## 2017-04-12 DIAGNOSIS — J309 Allergic rhinitis, unspecified: Secondary | ICD-10-CM

## 2017-04-26 ENCOUNTER — Ambulatory Visit (INDEPENDENT_AMBULATORY_CARE_PROVIDER_SITE_OTHER): Payer: Medicare Other | Admitting: *Deleted

## 2017-04-26 DIAGNOSIS — J309 Allergic rhinitis, unspecified: Secondary | ICD-10-CM

## 2017-05-02 NOTE — Progress Notes (Signed)
VIALS EXP 05-04-18

## 2017-05-08 DIAGNOSIS — J301 Allergic rhinitis due to pollen: Secondary | ICD-10-CM | POA: Diagnosis not present

## 2017-05-10 ENCOUNTER — Ambulatory Visit (INDEPENDENT_AMBULATORY_CARE_PROVIDER_SITE_OTHER): Payer: Medicare Other | Admitting: *Deleted

## 2017-05-10 DIAGNOSIS — J309 Allergic rhinitis, unspecified: Secondary | ICD-10-CM | POA: Diagnosis not present

## 2017-05-24 ENCOUNTER — Ambulatory Visit (INDEPENDENT_AMBULATORY_CARE_PROVIDER_SITE_OTHER): Payer: Medicare Other | Admitting: *Deleted

## 2017-05-24 DIAGNOSIS — J309 Allergic rhinitis, unspecified: Secondary | ICD-10-CM

## 2017-06-07 ENCOUNTER — Ambulatory Visit (INDEPENDENT_AMBULATORY_CARE_PROVIDER_SITE_OTHER): Payer: Medicare Other

## 2017-06-07 DIAGNOSIS — J309 Allergic rhinitis, unspecified: Secondary | ICD-10-CM

## 2017-06-14 ENCOUNTER — Ambulatory Visit (INDEPENDENT_AMBULATORY_CARE_PROVIDER_SITE_OTHER): Payer: Medicare Other | Admitting: *Deleted

## 2017-06-14 DIAGNOSIS — J309 Allergic rhinitis, unspecified: Secondary | ICD-10-CM

## 2017-06-21 ENCOUNTER — Ambulatory Visit (INDEPENDENT_AMBULATORY_CARE_PROVIDER_SITE_OTHER): Payer: Medicare Other | Admitting: *Deleted

## 2017-06-21 DIAGNOSIS — J309 Allergic rhinitis, unspecified: Secondary | ICD-10-CM | POA: Diagnosis not present

## 2017-06-28 ENCOUNTER — Ambulatory Visit (INDEPENDENT_AMBULATORY_CARE_PROVIDER_SITE_OTHER): Payer: Medicare Other | Admitting: *Deleted

## 2017-06-28 DIAGNOSIS — J309 Allergic rhinitis, unspecified: Secondary | ICD-10-CM

## 2017-07-07 ENCOUNTER — Ambulatory Visit (INDEPENDENT_AMBULATORY_CARE_PROVIDER_SITE_OTHER): Payer: Medicare Other

## 2017-07-07 DIAGNOSIS — J309 Allergic rhinitis, unspecified: Secondary | ICD-10-CM | POA: Diagnosis not present

## 2017-07-19 ENCOUNTER — Ambulatory Visit (INDEPENDENT_AMBULATORY_CARE_PROVIDER_SITE_OTHER): Payer: Medicare Other

## 2017-07-19 DIAGNOSIS — J309 Allergic rhinitis, unspecified: Secondary | ICD-10-CM | POA: Diagnosis not present

## 2017-08-02 ENCOUNTER — Ambulatory Visit (INDEPENDENT_AMBULATORY_CARE_PROVIDER_SITE_OTHER): Payer: Medicare Other

## 2017-08-02 DIAGNOSIS — J309 Allergic rhinitis, unspecified: Secondary | ICD-10-CM | POA: Diagnosis not present

## 2017-08-16 ENCOUNTER — Ambulatory Visit (INDEPENDENT_AMBULATORY_CARE_PROVIDER_SITE_OTHER): Payer: Medicare Other | Admitting: *Deleted

## 2017-08-16 DIAGNOSIS — J309 Allergic rhinitis, unspecified: Secondary | ICD-10-CM

## 2017-08-18 ENCOUNTER — Encounter: Payer: Self-pay | Admitting: *Deleted

## 2017-08-18 NOTE — Progress Notes (Signed)
Maintenance vial made. Exp: 08-19-18. hv

## 2017-08-30 ENCOUNTER — Ambulatory Visit (INDEPENDENT_AMBULATORY_CARE_PROVIDER_SITE_OTHER): Payer: Medicare Other | Admitting: *Deleted

## 2017-08-30 DIAGNOSIS — J309 Allergic rhinitis, unspecified: Secondary | ICD-10-CM

## 2017-08-30 NOTE — Progress Notes (Signed)
VIAL EXP 08-31-18

## 2017-08-31 DIAGNOSIS — J301 Allergic rhinitis due to pollen: Secondary | ICD-10-CM | POA: Diagnosis not present

## 2017-09-12 ENCOUNTER — Ambulatory Visit (INDEPENDENT_AMBULATORY_CARE_PROVIDER_SITE_OTHER): Payer: Medicare Other | Admitting: *Deleted

## 2017-09-12 DIAGNOSIS — J309 Allergic rhinitis, unspecified: Secondary | ICD-10-CM

## 2017-09-27 ENCOUNTER — Ambulatory Visit (INDEPENDENT_AMBULATORY_CARE_PROVIDER_SITE_OTHER): Payer: Medicare Other | Admitting: *Deleted

## 2017-09-27 DIAGNOSIS — J309 Allergic rhinitis, unspecified: Secondary | ICD-10-CM

## 2017-10-04 ENCOUNTER — Ambulatory Visit (INDEPENDENT_AMBULATORY_CARE_PROVIDER_SITE_OTHER): Payer: Medicare Other

## 2017-10-04 DIAGNOSIS — J309 Allergic rhinitis, unspecified: Secondary | ICD-10-CM | POA: Diagnosis not present

## 2017-10-11 ENCOUNTER — Ambulatory Visit (INDEPENDENT_AMBULATORY_CARE_PROVIDER_SITE_OTHER): Payer: Medicare Other

## 2017-10-11 DIAGNOSIS — J309 Allergic rhinitis, unspecified: Secondary | ICD-10-CM | POA: Diagnosis not present

## 2017-10-23 ENCOUNTER — Ambulatory Visit (INDEPENDENT_AMBULATORY_CARE_PROVIDER_SITE_OTHER): Payer: Medicare Other | Admitting: *Deleted

## 2017-10-23 DIAGNOSIS — J309 Allergic rhinitis, unspecified: Secondary | ICD-10-CM

## 2017-10-30 ENCOUNTER — Encounter: Payer: Self-pay | Admitting: Allergy & Immunology

## 2017-10-30 ENCOUNTER — Ambulatory Visit: Payer: Self-pay

## 2017-10-30 ENCOUNTER — Ambulatory Visit: Payer: Medicare Other | Admitting: Allergy & Immunology

## 2017-10-30 VITALS — BP 118/78 | HR 62 | Resp 18 | Ht 66.0 in | Wt 180.0 lb

## 2017-10-30 DIAGNOSIS — J3089 Other allergic rhinitis: Secondary | ICD-10-CM | POA: Diagnosis not present

## 2017-10-30 DIAGNOSIS — J302 Other seasonal allergic rhinitis: Secondary | ICD-10-CM

## 2017-10-30 DIAGNOSIS — J309 Allergic rhinitis, unspecified: Secondary | ICD-10-CM | POA: Diagnosis not present

## 2017-10-30 DIAGNOSIS — J4520 Mild intermittent asthma, uncomplicated: Secondary | ICD-10-CM

## 2017-10-30 MED ORDER — ALBUTEROL SULFATE HFA 108 (90 BASE) MCG/ACT IN AERS: 2.0000 | INHALATION_SPRAY | RESPIRATORY_TRACT | 1 refills | Status: DC | PRN

## 2017-10-30 NOTE — Patient Instructions (Addendum)
1. Mild intermittent asthma, uncomplicated - Lung testing was normal today.  - Continue with ProAir four puffs at needed.   2. Chronic rhinitis (grasses, weeds, cat, and dust mite) - maintenance reached July 2018 - Continue with allergy shots at the same schedule (we will try to get your advance increased a bit)  - Continue with Nasacort 1-2 sprays per nostril 1-2 times daily as needed. - Consider the use of an antihistamine as needed for the worst symptoms.   3. Return in about 1 year (around 10/31/2018).  Please inform us of any Emergency Department visits, hospitalizations, or changes in symptoms. Call us before going to the ED for breathing or allergy symptoms since we might be able to fit you in for a sick visit. Feel free to contact us anytime with any questions, problems, or concerns.  It was a pleasure to see you again today! Happy fall!   Websites that have reliable patient information: 1. American Academy of Asthma, Allergy, and Immunology: www.aaaai.org 2. Food Allergy Research and Education (FARE): foodallergy.org 3. Mothers of Asthmatics: http://www.asthmacommunitynetwork.org 4. American College of Allergy, Asthma, and Immunology: www.acaai.org

## 2017-10-30 NOTE — Progress Notes (Signed)
FOLLOW UP  Date of Service/Encounter:  10/30/17   Assessment:   Mild intermittent asthma, uncomplicated  Seasonal and perennial allergic rhinitis (grasses, weeds, cat, and dust mite)  Asthma Reportables:  Severity: intermittent  Risk: low Control: well controlled  Plan/Recommendations:   1. Mild intermittent asthma, uncomplicated - Lung testing was normal today.  - Continue with ProAir four puffs at needed.   2. Chronic rhinitis (grasses, weeds, cat, and dust mite) - maintenance reached July 2018 - Continue with allergy shots at the same schedule (we will try to get your advance increased a bit)  - Continue with Nasacort 1-2 sprays per nostril 1-2 times daily as needed. - Consider the use of an antihistamine as needed for the worst symptoms.   3. Return in about 1 year (around 10/31/2018).    Subjective:   [name] [name] is a 72 y.o. female presenting today for follow up of  Chief Complaint  Patient presents with  . Asthma    no problems with Asthma. no albuterol in past year. doing well.   . Allergic Rhinitis     typical burning eyes, runny nose. taking antihistamine daily now and seems to be lots better.     [name] [name] has a history of the following: Patient Active Problem List   Diagnosis Date Noted  . ALLERGIC CONJUNCTIVITIS 09/26/2007  . Seasonal and perennial allergic rhinitis 09/26/2007  . Mild intermittent asthma, uncomplicated 01/60/1093    History obtained from: chart review and patient.  [name] [name] Primary Care Provider is Kathyrn Lass, MD.     [name] [name] is a 72 y.o. female presenting for a follow up visit. She was last seen one year ago. At that time, she was doing very well.  Her asthma was under good control with pro-air as needed.  We continued her allergy shots on the same schedule.  She has sensitizations to grasses, weeds, cat, and dust mite.  We continue Nasacort 1 to 2 sprays per nostril daily as needed and antihistamine as  needed.  Since the last visit, she has done well. For the past 2-3 threes years, her asthma has been well controlled. She does use her albuterol every once in a while. She has not needed prednisone since the last visit and has not needed any antibiotics since the last visit. She is very happy with how well she is doing overall.   [name] [name] is on allergen immunotherapy. She receives one injection. Immunotherapy script #1 contains weeds, grasses, dust mites and cat. She currently receives 0.68mL of the RED vial (1/100). She started shots May of 2018 and reached maintenance in July of 2018. She did have some symptoms over the last couple of weeks which required her to restart her generic Allegra, with relief of her symptoms. Typically she is not on any medications at all.  Otherwise, there have been no changes to her past medical history, surgical history, family history, or social history. Her husband is still recovering from his stem cell transplant. He has about one more year to recover, but all things considered, he is doing quite well.     Review of Systems: a 14-point review of systems is pertinent for what is mentioned in HPI.  Otherwise, all other systems were negative. Constitutional: negative other than that listed in the HPI Eyes: negative other than that listed in the HPI Ears, nose, mouth, throat, and face: negative other than that listed in the HPI Respiratory: negative other than that listed in the  HPI Cardiovascular: negative other than that listed in the HPI Gastrointestinal: negative other than that listed in the HPI Genitourinary: negative other than that listed in the HPI Integument: negative other than that listed in the HPI Hematologic: negative other than that listed in the HPI Musculoskeletal: negative other than that listed in the HPI Neurological: negative other than that listed in the HPI Allergy/Immunologic: negative other than that listed in the HPI    Objective:    Blood pressure 118/78, pulse 62, resp. rate 18, height 5\' 6"  (1.676 m), weight 180 lb (81.6 kg), SpO2 98 %. Body mass index is 29.05 kg/m.   Physical Exam:  General: Alert, interactive, in no acute distress. Pleasant talkative female.  Eyes: No conjunctival injection bilaterally, no discharge on the right, no discharge on the left and no Horner-Trantas dots present. PERRL bilaterally. EOMI without pain. No photophobia.  Ears: Right TM pearly gray with normal light reflex, Left TM pearly gray with normal light reflex, Right TM intact without perforation and Left TM intact without perforation.  Nose/Throat: External nose within normal limits and nasal crease present. Turbinates edematous and pale with clear discharge. Posterior oropharynx erythematous without cobblestoning in the posterior oropharynx. Tonsils 2+ without exudates.  Tongue without thrush. Lungs: Clear to auscultation without wheezing, rhonchi or rales. No increased work of breathing. CV: Normal S1/S2. No murmurs. Capillary refill <2 seconds.  Skin: Warm and dry, without lesions or rashes. Neuro:   Grossly intact. No focal deficits appreciated. Responsive to questions.  Diagnostic studies:   Spirometry: results normal (FEV1: 1.97/80%, FVC: 2.36/72%, FEV1/FVC: 83%).    Spirometry consistent with normal pattern.   Allergy Studies: none     Salvatore Marvel, MD  [facility] [place]

## 2017-10-31 NOTE — Addendum Note (Signed)
Addended by: Lucrezia Starch I on: 10/31/2017 07:05 AM   Modules accepted: Orders

## 2017-11-17 ENCOUNTER — Ambulatory Visit (INDEPENDENT_AMBULATORY_CARE_PROVIDER_SITE_OTHER): Payer: Medicare Other

## 2017-11-17 DIAGNOSIS — J309 Allergic rhinitis, unspecified: Secondary | ICD-10-CM | POA: Diagnosis not present

## 2017-11-30 ENCOUNTER — Ambulatory Visit (INDEPENDENT_AMBULATORY_CARE_PROVIDER_SITE_OTHER): Payer: Medicare Other

## 2017-11-30 DIAGNOSIS — J309 Allergic rhinitis, unspecified: Secondary | ICD-10-CM | POA: Diagnosis not present

## 2017-12-14 ENCOUNTER — Other Ambulatory Visit: Payer: Self-pay | Admitting: Obstetrics & Gynecology

## 2017-12-14 DIAGNOSIS — Z1231 Encounter for screening mammogram for malignant neoplasm of breast: Secondary | ICD-10-CM

## 2017-12-15 ENCOUNTER — Ambulatory Visit (INDEPENDENT_AMBULATORY_CARE_PROVIDER_SITE_OTHER): Payer: Medicare Other | Admitting: *Deleted

## 2017-12-15 DIAGNOSIS — J309 Allergic rhinitis, unspecified: Secondary | ICD-10-CM

## 2018-01-03 ENCOUNTER — Ambulatory Visit (INDEPENDENT_AMBULATORY_CARE_PROVIDER_SITE_OTHER): Payer: Medicare Other | Admitting: *Deleted

## 2018-01-03 DIAGNOSIS — J309 Allergic rhinitis, unspecified: Secondary | ICD-10-CM

## 2018-01-18 ENCOUNTER — Ambulatory Visit (INDEPENDENT_AMBULATORY_CARE_PROVIDER_SITE_OTHER): Payer: Medicare Other | Admitting: *Deleted

## 2018-01-18 DIAGNOSIS — J309 Allergic rhinitis, unspecified: Secondary | ICD-10-CM

## 2018-01-21 DIAGNOSIS — J301 Allergic rhinitis due to pollen: Secondary | ICD-10-CM | POA: Diagnosis not present

## 2018-01-22 NOTE — Progress Notes (Signed)
Vials exp 01-23-19

## 2018-01-25 ENCOUNTER — Ambulatory Visit
Admission: RE | Admit: 2018-01-25 | Discharge: 2018-01-25 | Disposition: A | Discharge status: Home / Self Care | Payer: Medicare Other | Source: Ambulatory Visit | Attending: Obstetrics & Gynecology | Admitting: Obstetrics & Gynecology

## 2018-01-25 DIAGNOSIS — Z1231 Encounter for screening mammogram for malignant neoplasm of breast: Secondary | ICD-10-CM

## 2018-02-02 ENCOUNTER — Ambulatory Visit (INDEPENDENT_AMBULATORY_CARE_PROVIDER_SITE_OTHER): Payer: Medicare Other

## 2018-02-02 DIAGNOSIS — J309 Allergic rhinitis, unspecified: Secondary | ICD-10-CM

## 2018-02-16 ENCOUNTER — Ambulatory Visit (INDEPENDENT_AMBULATORY_CARE_PROVIDER_SITE_OTHER): Payer: Medicare Other | Admitting: *Deleted

## 2018-02-16 DIAGNOSIS — J309 Allergic rhinitis, unspecified: Secondary | ICD-10-CM

## 2018-02-23 ENCOUNTER — Ambulatory Visit (INDEPENDENT_AMBULATORY_CARE_PROVIDER_SITE_OTHER): Payer: Medicare Other | Admitting: *Deleted

## 2018-02-23 DIAGNOSIS — J309 Allergic rhinitis, unspecified: Secondary | ICD-10-CM | POA: Diagnosis not present

## 2018-03-01 ENCOUNTER — Ambulatory Visit (INDEPENDENT_AMBULATORY_CARE_PROVIDER_SITE_OTHER): Payer: Medicare Other

## 2018-03-01 DIAGNOSIS — J309 Allergic rhinitis, unspecified: Secondary | ICD-10-CM

## 2018-03-09 ENCOUNTER — Ambulatory Visit (INDEPENDENT_AMBULATORY_CARE_PROVIDER_SITE_OTHER): Payer: Medicare Other | Admitting: *Deleted

## 2018-03-09 DIAGNOSIS — J309 Allergic rhinitis, unspecified: Secondary | ICD-10-CM

## 2018-03-16 ENCOUNTER — Ambulatory Visit (INDEPENDENT_AMBULATORY_CARE_PROVIDER_SITE_OTHER): Payer: Medicare Other

## 2018-03-16 DIAGNOSIS — J309 Allergic rhinitis, unspecified: Secondary | ICD-10-CM

## 2018-03-23 ENCOUNTER — Ambulatory Visit (INDEPENDENT_AMBULATORY_CARE_PROVIDER_SITE_OTHER): Payer: Medicare Other | Admitting: *Deleted

## 2018-03-23 DIAGNOSIS — J309 Allergic rhinitis, unspecified: Secondary | ICD-10-CM | POA: Diagnosis not present

## 2018-03-29 ENCOUNTER — Ambulatory Visit (INDEPENDENT_AMBULATORY_CARE_PROVIDER_SITE_OTHER): Payer: Medicare Other

## 2018-03-29 DIAGNOSIS — J309 Allergic rhinitis, unspecified: Secondary | ICD-10-CM | POA: Diagnosis not present

## 2018-04-12 ENCOUNTER — Ambulatory Visit (INDEPENDENT_AMBULATORY_CARE_PROVIDER_SITE_OTHER): Payer: Medicare Other | Admitting: *Deleted

## 2018-04-12 DIAGNOSIS — J309 Allergic rhinitis, unspecified: Secondary | ICD-10-CM

## 2018-04-16 NOTE — Progress Notes (Signed)
EXP 04/17/19

## 2018-04-17 DIAGNOSIS — J301 Allergic rhinitis due to pollen: Secondary | ICD-10-CM | POA: Diagnosis not present

## 2018-04-26 ENCOUNTER — Ambulatory Visit (INDEPENDENT_AMBULATORY_CARE_PROVIDER_SITE_OTHER): Payer: Medicare Other | Admitting: *Deleted

## 2018-04-26 DIAGNOSIS — J309 Allergic rhinitis, unspecified: Secondary | ICD-10-CM | POA: Diagnosis not present

## 2018-05-10 ENCOUNTER — Ambulatory Visit (INDEPENDENT_AMBULATORY_CARE_PROVIDER_SITE_OTHER): Payer: Medicare Other | Admitting: *Deleted

## 2018-05-10 DIAGNOSIS — J309 Allergic rhinitis, unspecified: Secondary | ICD-10-CM

## 2018-05-25 ENCOUNTER — Ambulatory Visit (INDEPENDENT_AMBULATORY_CARE_PROVIDER_SITE_OTHER): Payer: Medicare Other | Admitting: *Deleted

## 2018-05-25 DIAGNOSIS — J309 Allergic rhinitis, unspecified: Secondary | ICD-10-CM | POA: Diagnosis not present

## 2018-06-01 ENCOUNTER — Ambulatory Visit (INDEPENDENT_AMBULATORY_CARE_PROVIDER_SITE_OTHER): Payer: Medicare Other | Admitting: *Deleted

## 2018-06-01 DIAGNOSIS — J309 Allergic rhinitis, unspecified: Secondary | ICD-10-CM

## 2018-06-08 ENCOUNTER — Ambulatory Visit (INDEPENDENT_AMBULATORY_CARE_PROVIDER_SITE_OTHER): Payer: Medicare Other | Admitting: *Deleted

## 2018-06-08 DIAGNOSIS — J309 Allergic rhinitis, unspecified: Secondary | ICD-10-CM | POA: Diagnosis not present

## 2018-06-18 ENCOUNTER — Ambulatory Visit (INDEPENDENT_AMBULATORY_CARE_PROVIDER_SITE_OTHER): Payer: Medicare Other

## 2018-06-18 DIAGNOSIS — J309 Allergic rhinitis, unspecified: Secondary | ICD-10-CM

## 2018-06-25 ENCOUNTER — Ambulatory Visit (INDEPENDENT_AMBULATORY_CARE_PROVIDER_SITE_OTHER): Payer: Medicare Other

## 2018-06-25 DIAGNOSIS — J309 Allergic rhinitis, unspecified: Secondary | ICD-10-CM

## 2018-07-16 ENCOUNTER — Ambulatory Visit (INDEPENDENT_AMBULATORY_CARE_PROVIDER_SITE_OTHER): Payer: Medicare Other

## 2018-07-16 DIAGNOSIS — J309 Allergic rhinitis, unspecified: Secondary | ICD-10-CM

## 2018-08-06 ENCOUNTER — Ambulatory Visit (INDEPENDENT_AMBULATORY_CARE_PROVIDER_SITE_OTHER): Payer: Medicare Other | Admitting: *Deleted

## 2018-08-06 DIAGNOSIS — J309 Allergic rhinitis, unspecified: Secondary | ICD-10-CM | POA: Diagnosis not present

## 2018-08-28 ENCOUNTER — Ambulatory Visit (INDEPENDENT_AMBULATORY_CARE_PROVIDER_SITE_OTHER): Payer: Medicare Other

## 2018-08-28 DIAGNOSIS — J3089 Other allergic rhinitis: Secondary | ICD-10-CM | POA: Diagnosis not present

## 2018-08-28 DIAGNOSIS — J302 Other seasonal allergic rhinitis: Secondary | ICD-10-CM | POA: Diagnosis not present

## 2018-08-29 DIAGNOSIS — J301 Allergic rhinitis due to pollen: Secondary | ICD-10-CM | POA: Diagnosis not present

## 2018-08-29 NOTE — Progress Notes (Signed)
VIAL EXP 08-29-2019

## 2018-09-18 ENCOUNTER — Ambulatory Visit (INDEPENDENT_AMBULATORY_CARE_PROVIDER_SITE_OTHER): Payer: Medicare Other | Admitting: *Deleted

## 2018-09-18 DIAGNOSIS — J309 Allergic rhinitis, unspecified: Secondary | ICD-10-CM

## 2018-10-09 ENCOUNTER — Ambulatory Visit (INDEPENDENT_AMBULATORY_CARE_PROVIDER_SITE_OTHER): Payer: Medicare Other | Admitting: *Deleted

## 2018-10-09 DIAGNOSIS — J309 Allergic rhinitis, unspecified: Secondary | ICD-10-CM | POA: Diagnosis not present

## 2018-10-23 ENCOUNTER — Ambulatory Visit: Payer: Medicare Other | Admitting: Allergy & Immunology

## 2018-10-23 ENCOUNTER — Encounter: Payer: Self-pay | Admitting: Allergy & Immunology

## 2018-10-23 ENCOUNTER — Other Ambulatory Visit: Payer: Self-pay

## 2018-10-23 VITALS — BP 128/72 | HR 64 | Temp 97.5°F | Resp 16 | Ht 66.0 in | Wt 162.4 lb

## 2018-10-23 DIAGNOSIS — J302 Other seasonal allergic rhinitis: Secondary | ICD-10-CM | POA: Diagnosis not present

## 2018-10-23 DIAGNOSIS — J4520 Mild intermittent asthma, uncomplicated: Secondary | ICD-10-CM

## 2018-10-23 DIAGNOSIS — J3089 Other allergic rhinitis: Secondary | ICD-10-CM

## 2018-10-23 NOTE — Progress Notes (Signed)
FOLLOW UP  Date of Service/Encounter:  10/23/18   Assessment:   Mild intermittent asthma, uncomplicated  Seasonaland perennialallergic rhinitis(grasses, weeds, cat, and dust mite)  Asthma Reportables: Severity:intermittent Risk:low Control:well controlled   Overall, [name] [name] is doing well on the current regimen.  [name] [name] does report that [name] [name] is not getting 100% relief during the spring season.  This is certainly her worst time of the year and [name] [name] would like to try other methods of controlling the symptoms.  Based on the conversation with her, we are going to re-mix her vials to make her springtime pollens a little stronger in her prescription.  I am also going to remove the dust mite and put it in a separate vial since this does contain some protease as that might be breaking down some of the pollens and pet dander.  At the start of her next vials, we will increase on Schedule B and then space out to every 4 weeks thereafter when [name] [name] reaches 0.5 mL.  Plan/Recommendations:   1. Mild intermittent asthma, uncomplicated - Lung testing was normal today.  - Continue with ProAir four puffs at needed.   2. Chronic rhinitis (grasses, weeds, cat, and dust mite) - maintenance reached July 2018 - Continue with allergy shots at the same schedule. - We will push the pollens a bit more in the next vial to provide more protection. - Continue with Nasacort 1-2 sprays per nostril daily as needed.  - Zerviate eye drop samples provided.  - Consider the use of an antihistamine as needed for the worst symptoms.    3. Return in about 1 year (around 10/23/2019). This can be an in-person, a virtual Webex or a telephone follow up visit.    Subjective:   [name] [name] is a 73 y.o. female presenting today for follow up of  Chief Complaint  Patient presents with  . Asthma    [name] [name] has a history of the following: Patient Active Problem List   Diagnosis Date Noted  . ALLERGIC  CONJUNCTIVITIS 09/26/2007  . Seasonal and perennial allergic rhinitis 09/26/2007  . Mild intermittent asthma, uncomplicated [id number]    History obtained from: chart review and patient and aunt.  [name] [name] is a 73 y.o. female presenting for a follow up visit.  [name] [name] was last seen in September 2019.  At that time, her lung testing looked great.  We continued with albuterol 4 puffs as needed.  For her perennial and seasonal allergic rhinitis, we continued with allergy shots as well as Nasacort 1 to 2 sprays per nostril up to twice daily.  [name] [name] reached maintenance in July 2018.  Since last visit, [name] [name] has done very well.  [name] [name] is very happy with how the allergy shots have controlled her headaches and allergy symptoms.  [name] [name] is using her medicines on an as-needed basis.  Overall, [name] [name] feels 75% better.  [name] [name] does report that [name] [name] continues to have symptoms in the springtime.  [name] [name] is open to additional ideas on controlling those.  [name] on allergen immunotherapy. [name] [name] one [name]. Immunotherapy script #1contains weeds, grasses, dust mites and cat.Shecurrently receives 0.60mLof the RED vial (1/100).Shestarted shots Mayof 2018and reached maintenance in Julyof 2018.  [name] [name] does report that the seasonal allergies cause some eye burning. [name] [name] does have some OTC eye drops, Pataday.  [name] [name] reports burning more than itching, which Pataday does help with.  [name] [name]'s asthma has been well controlled. [name] [name] has not required rescue medication, experienced nocturnal awakenings due to lower respiratory  symptoms, nor have activities of daily living been limited. [name] [name] has required no Emergency Department or Urgent Care visits for her asthma. [name] [name] has required zero courses of systemic steroids for asthma exacerbations since the last visit. ACT score today is 25, indicating excellent asthma symptom control. [name] [name] uses only albuterol as needed.  They have remained safe during the pandemic.  The did have to cancel a trip to  [place] for their 50th anniversary.  Otherwise, there have been no changes to her past medical history, surgical history, family history, or social history.    Review of Systems  Constitutional: Negative.  Negative for chills, fever, malaise/fatigue and weight loss.  HENT: Negative.  Negative for congestion, ear discharge and ear pain.   Eyes: Negative for pain, discharge and redness.  Respiratory: Negative for cough, sputum production, shortness of breath and wheezing.   Cardiovascular: Negative.  Negative for chest pain and palpitations.  Gastrointestinal: Negative for abdominal pain, constipation, diarrhea, heartburn, nausea and vomiting.  Skin: Negative.  Negative for itching and rash.  Neurological: Negative for dizziness and headaches.  Endo/Heme/Allergies: Negative for environmental allergies. Does not bruise/bleed easily.       Objective:   Blood pressure 128/72, pulse 64, temperature (!) 97.5 F (36.4 C), temperature source Temporal, resp. rate 16, height 5\' 6"  (1.676 m), weight 162 lb 6.4 oz (73.7 kg), SpO2 97 %. Body mass index is 26.21 kg/m.   Physical Exam:  Physical Exam  Constitutional: [name] [name] appears well-developed.  Pleasant talkative female.  HENT:  Head: Normocephalic and atraumatic.  Right Ear: Tympanic membrane, external ear and ear canal normal.  Left Ear: Tympanic membrane, external ear and ear canal normal.  Nose: Mucosal edema present. No rhinorrhea, nasal deformity or septal deviation. No epistaxis. Right sinus exhibits no maxillary sinus tenderness and no frontal sinus tenderness. Left sinus exhibits no maxillary sinus tenderness and no frontal sinus tenderness.  Mouth/Throat: Uvula is midline and oropharynx is clear and moist. Mucous membranes are not pale and not dry.  Eyes: Pupils are equal, round, and reactive to light. Conjunctivae and EOM are normal. Right eye exhibits no chemosis and no discharge. Left eye exhibits no chemosis and no discharge.  Right conjunctiva is not injected. Left conjunctiva is not injected.  Cardiovascular: Normal rate, regular rhythm and normal heart sounds.  Respiratory: Effort normal and breath sounds normal. No accessory muscle usage. No tachypnea. No respiratory distress. [name] [name] has no wheezes. [name] [name] has no rhonchi. [name] [name] has no rales. [name] [name] exhibits no tenderness.  Moving air well in all lung fields.  Lymphadenopathy:    [name] [name] has no cervical adenopathy.  Neurological: [name] [name] is alert.  Skin: No abrasion, no petechiae and no rash noted. Rash is not papular, not vesicular and not urticarial. No erythema. No pallor.  Psychiatric: [name] [name] has a normal mood and affect.     Diagnostic studies:    Spirometry: results normal (FEV1: 2.25/92%, FVC: 2.71/84%, FEV1/FVC: 83%).    Spirometry consistent with normal pattern.   Allergy Studies: none      Salvatore Marvel, MD  [facility]

## 2018-10-23 NOTE — Patient Instructions (Addendum)
1. Mild intermittent asthma, uncomplicated - Lung testing was normal today.  - Continue with ProAir four puffs at needed.   2. Chronic rhinitis (grasses, weeds, cat, and dust mite) - maintenance reached July 2018 - Continue with allergy shots at the same schedule. - We will push the pollens a bit more in the next vial to provide more protection. - Continue with Nasacort 1-2 sprays per nostril daily as needed.  - Zerviate eye drop samples provided.  - Consider the use of an antihistamine as needed for the worst symptoms.    3. Return in about 1 year (around 10/23/2019). This can be an in-person, a virtual Webex or a telephone follow up visit.   Please inform us of any Emergency Department visits, hospitalizations, or changes in symptoms. Call us before going to the ED for breathing or allergy symptoms since we might be able to fit you in for a sick visit. Feel free to contact us anytime with any questions, problems, or concerns.  It was a pleasure to see you again today!  Websites that have reliable patient information: 1. American Academy of Asthma, Allergy, and Immunology: www.aaaai.org 2. Food Allergy Research and Education (FARE): foodallergy.org 3. Mothers of Asthmatics: http://www.asthmacommunitynetwork.org 4. American College of Allergy, Asthma, and Immunology: www.acaai.org  "Like" [place] on Facebook and Instagram for our latest updates!      Make sure you are registered to vote! If you have moved or changed any of your contact information, you will need to get this updated before voting!  In some cases, you MAY be able to register to vote online: [email or web address]    Voter ID laws are NOT going into effect for the General Election in November 2020! DO NOT let this stop you from exercising your right to vote!   Absentee voting is the SAFEST way to vote during the coronavirus pandemic!   Download and print an absentee ballot request form at  [email or web address] or you can scan the QR code below with your smart phone:      More information on absentee ballots can be found here: [email or web address]  [place] VOTING SITES have been established! You can register to vote and cast your vote on the same day at these locations. See this site for more information on what you need to register to vote: [email or web address]

## 2018-10-29 ENCOUNTER — Ambulatory Visit (INDEPENDENT_AMBULATORY_CARE_PROVIDER_SITE_OTHER): Payer: Medicare Other

## 2018-10-29 DIAGNOSIS — J309 Allergic rhinitis, unspecified: Secondary | ICD-10-CM

## 2018-10-30 ENCOUNTER — Ambulatory Visit: Payer: Medicare Other | Admitting: Allergy & Immunology

## 2018-11-05 ENCOUNTER — Ambulatory Visit (INDEPENDENT_AMBULATORY_CARE_PROVIDER_SITE_OTHER): Payer: Medicare Other

## 2018-11-05 DIAGNOSIS — J309 Allergic rhinitis, unspecified: Secondary | ICD-10-CM | POA: Diagnosis not present

## 2018-11-12 ENCOUNTER — Ambulatory Visit (INDEPENDENT_AMBULATORY_CARE_PROVIDER_SITE_OTHER): Payer: Medicare Other | Admitting: *Deleted

## 2018-11-12 DIAGNOSIS — J309 Allergic rhinitis, unspecified: Secondary | ICD-10-CM | POA: Diagnosis not present

## 2018-11-19 ENCOUNTER — Ambulatory Visit (INDEPENDENT_AMBULATORY_CARE_PROVIDER_SITE_OTHER): Payer: Medicare Other

## 2018-11-19 DIAGNOSIS — J309 Allergic rhinitis, unspecified: Secondary | ICD-10-CM

## 2018-11-23 ENCOUNTER — Other Ambulatory Visit: Payer: Self-pay | Admitting: Family Medicine

## 2018-11-23 DIAGNOSIS — M8580 Other specified disorders of bone density and structure, unspecified site: Secondary | ICD-10-CM

## 2018-11-26 ENCOUNTER — Ambulatory Visit (INDEPENDENT_AMBULATORY_CARE_PROVIDER_SITE_OTHER): Payer: Medicare Other

## 2018-11-26 DIAGNOSIS — J309 Allergic rhinitis, unspecified: Secondary | ICD-10-CM | POA: Diagnosis not present

## 2018-11-29 ENCOUNTER — Ambulatory Visit
Admission: RE | Admit: 2018-11-29 | Discharge: 2018-11-29 | Disposition: A | Discharge status: Home / Self Care | Payer: Medicare Other | Source: Ambulatory Visit | Attending: Family Medicine | Admitting: Family Medicine

## 2018-11-29 ENCOUNTER — Other Ambulatory Visit: Payer: Self-pay

## 2018-11-29 DIAGNOSIS — M8580 Other specified disorders of bone density and structure, unspecified site: Secondary | ICD-10-CM

## 2018-12-17 ENCOUNTER — Ambulatory Visit (INDEPENDENT_AMBULATORY_CARE_PROVIDER_SITE_OTHER): Payer: Medicare Other

## 2018-12-17 DIAGNOSIS — J309 Allergic rhinitis, unspecified: Secondary | ICD-10-CM

## 2018-12-27 ENCOUNTER — Other Ambulatory Visit: Payer: Self-pay | Admitting: Family Medicine

## 2018-12-27 DIAGNOSIS — Z1231 Encounter for screening mammogram for malignant neoplasm of breast: Secondary | ICD-10-CM

## 2019-01-07 ENCOUNTER — Ambulatory Visit (INDEPENDENT_AMBULATORY_CARE_PROVIDER_SITE_OTHER): Payer: Medicare Other | Admitting: *Deleted

## 2019-01-07 DIAGNOSIS — J309 Allergic rhinitis, unspecified: Secondary | ICD-10-CM

## 2019-01-28 ENCOUNTER — Ambulatory Visit (INDEPENDENT_AMBULATORY_CARE_PROVIDER_SITE_OTHER): Payer: Medicare Other

## 2019-01-28 DIAGNOSIS — J309 Allergic rhinitis, unspecified: Secondary | ICD-10-CM

## 2019-02-18 ENCOUNTER — Ambulatory Visit
Admission: RE | Admit: 2019-02-18 | Discharge: 2019-02-18 | Disposition: A | Discharge status: Home / Self Care | Payer: Medicare Other | Source: Ambulatory Visit | Attending: Family Medicine | Admitting: Family Medicine

## 2019-02-18 ENCOUNTER — Other Ambulatory Visit: Payer: Self-pay

## 2019-02-18 DIAGNOSIS — Z1231 Encounter for screening mammogram for malignant neoplasm of breast: Secondary | ICD-10-CM

## 2019-02-19 ENCOUNTER — Ambulatory Visit (INDEPENDENT_AMBULATORY_CARE_PROVIDER_SITE_OTHER): Payer: Medicare Other

## 2019-02-19 DIAGNOSIS — J309 Allergic rhinitis, unspecified: Secondary | ICD-10-CM

## 2019-03-12 ENCOUNTER — Ambulatory Visit (INDEPENDENT_AMBULATORY_CARE_PROVIDER_SITE_OTHER): Payer: Medicare Other

## 2019-03-12 DIAGNOSIS — J309 Allergic rhinitis, unspecified: Secondary | ICD-10-CM | POA: Diagnosis not present

## 2019-03-18 NOTE — Progress Notes (Signed)
EXP 03/18/20

## 2019-03-19 DIAGNOSIS — J301 Allergic rhinitis due to pollen: Secondary | ICD-10-CM | POA: Diagnosis not present

## 2019-03-20 DIAGNOSIS — J3089 Other allergic rhinitis: Secondary | ICD-10-CM | POA: Diagnosis not present

## 2019-04-02 ENCOUNTER — Ambulatory Visit (INDEPENDENT_AMBULATORY_CARE_PROVIDER_SITE_OTHER): Payer: Medicare Other

## 2019-04-02 DIAGNOSIS — J309 Allergic rhinitis, unspecified: Secondary | ICD-10-CM | POA: Diagnosis not present

## 2019-04-26 ENCOUNTER — Ambulatory Visit (INDEPENDENT_AMBULATORY_CARE_PROVIDER_SITE_OTHER): Payer: Medicare Other

## 2019-04-26 ENCOUNTER — Other Ambulatory Visit: Payer: Self-pay

## 2019-04-26 DIAGNOSIS — J309 Allergic rhinitis, unspecified: Secondary | ICD-10-CM | POA: Diagnosis not present

## 2019-04-26 NOTE — Progress Notes (Signed)
Patient started her new immunotherapy vials today. Both red vials. G-W-C & MITE. She started at 0.025cc of each vial. Patient did wait 30 minutes in the office post injection. She had no local or systemic reactions upon leaving the office.

## 2019-05-03 ENCOUNTER — Ambulatory Visit (INDEPENDENT_AMBULATORY_CARE_PROVIDER_SITE_OTHER): Payer: Medicare Other

## 2019-05-03 DIAGNOSIS — J309 Allergic rhinitis, unspecified: Secondary | ICD-10-CM

## 2019-05-09 ENCOUNTER — Ambulatory Visit (INDEPENDENT_AMBULATORY_CARE_PROVIDER_SITE_OTHER): Payer: Medicare Other | Admitting: *Deleted

## 2019-05-09 DIAGNOSIS — J309 Allergic rhinitis, unspecified: Secondary | ICD-10-CM

## 2019-05-16 ENCOUNTER — Ambulatory Visit (INDEPENDENT_AMBULATORY_CARE_PROVIDER_SITE_OTHER): Payer: Medicare Other

## 2019-05-16 DIAGNOSIS — J309 Allergic rhinitis, unspecified: Secondary | ICD-10-CM

## 2019-05-23 ENCOUNTER — Ambulatory Visit (INDEPENDENT_AMBULATORY_CARE_PROVIDER_SITE_OTHER): Payer: Medicare Other

## 2019-05-23 DIAGNOSIS — J309 Allergic rhinitis, unspecified: Secondary | ICD-10-CM

## 2019-05-31 ENCOUNTER — Ambulatory Visit (INDEPENDENT_AMBULATORY_CARE_PROVIDER_SITE_OTHER): Payer: Medicare Other

## 2019-05-31 DIAGNOSIS — J309 Allergic rhinitis, unspecified: Secondary | ICD-10-CM | POA: Diagnosis not present

## 2019-06-06 ENCOUNTER — Ambulatory Visit (INDEPENDENT_AMBULATORY_CARE_PROVIDER_SITE_OTHER): Payer: Medicare Other

## 2019-06-06 DIAGNOSIS — J309 Allergic rhinitis, unspecified: Secondary | ICD-10-CM

## 2019-06-14 ENCOUNTER — Ambulatory Visit (INDEPENDENT_AMBULATORY_CARE_PROVIDER_SITE_OTHER): Payer: Medicare Other

## 2019-06-14 DIAGNOSIS — J309 Allergic rhinitis, unspecified: Secondary | ICD-10-CM | POA: Diagnosis not present

## 2019-06-20 ENCOUNTER — Ambulatory Visit (INDEPENDENT_AMBULATORY_CARE_PROVIDER_SITE_OTHER): Payer: Medicare Other

## 2019-06-20 DIAGNOSIS — J309 Allergic rhinitis, unspecified: Secondary | ICD-10-CM | POA: Diagnosis not present

## 2019-06-27 ENCOUNTER — Ambulatory Visit (INDEPENDENT_AMBULATORY_CARE_PROVIDER_SITE_OTHER): Payer: Medicare Other

## 2019-06-27 DIAGNOSIS — J309 Allergic rhinitis, unspecified: Secondary | ICD-10-CM

## 2019-07-05 ENCOUNTER — Ambulatory Visit (INDEPENDENT_AMBULATORY_CARE_PROVIDER_SITE_OTHER): Payer: Medicare Other

## 2019-07-05 DIAGNOSIS — J309 Allergic rhinitis, unspecified: Secondary | ICD-10-CM | POA: Diagnosis not present

## 2019-07-12 ENCOUNTER — Ambulatory Visit (INDEPENDENT_AMBULATORY_CARE_PROVIDER_SITE_OTHER): Payer: Medicare Other

## 2019-07-12 DIAGNOSIS — J309 Allergic rhinitis, unspecified: Secondary | ICD-10-CM

## 2019-07-18 DIAGNOSIS — J302 Other seasonal allergic rhinitis: Secondary | ICD-10-CM | POA: Diagnosis not present

## 2019-07-18 NOTE — Progress Notes (Signed)
Exp 07/17/20

## 2019-07-29 DIAGNOSIS — J3089 Other allergic rhinitis: Secondary | ICD-10-CM | POA: Diagnosis not present

## 2019-08-02 ENCOUNTER — Ambulatory Visit (INDEPENDENT_AMBULATORY_CARE_PROVIDER_SITE_OTHER): Payer: Medicare Other | Admitting: *Deleted

## 2019-08-02 DIAGNOSIS — J309 Allergic rhinitis, unspecified: Secondary | ICD-10-CM | POA: Diagnosis not present

## 2019-08-21 ENCOUNTER — Ambulatory Visit (INDEPENDENT_AMBULATORY_CARE_PROVIDER_SITE_OTHER): Payer: Medicare Other

## 2019-08-21 DIAGNOSIS — J309 Allergic rhinitis, unspecified: Secondary | ICD-10-CM | POA: Diagnosis not present

## 2019-09-13 ENCOUNTER — Ambulatory Visit (INDEPENDENT_AMBULATORY_CARE_PROVIDER_SITE_OTHER): Payer: Medicare Other

## 2019-09-13 DIAGNOSIS — J309 Allergic rhinitis, unspecified: Secondary | ICD-10-CM

## 2019-09-20 ENCOUNTER — Ambulatory Visit (INDEPENDENT_AMBULATORY_CARE_PROVIDER_SITE_OTHER): Payer: Medicare Other | Admitting: *Deleted

## 2019-09-20 DIAGNOSIS — J309 Allergic rhinitis, unspecified: Secondary | ICD-10-CM | POA: Diagnosis not present

## 2019-09-27 ENCOUNTER — Ambulatory Visit (INDEPENDENT_AMBULATORY_CARE_PROVIDER_SITE_OTHER): Payer: Medicare Other | Admitting: *Deleted

## 2019-09-27 DIAGNOSIS — J309 Allergic rhinitis, unspecified: Secondary | ICD-10-CM

## 2019-10-04 ENCOUNTER — Ambulatory Visit (INDEPENDENT_AMBULATORY_CARE_PROVIDER_SITE_OTHER): Payer: Medicare Other | Admitting: *Deleted

## 2019-10-04 DIAGNOSIS — J309 Allergic rhinitis, unspecified: Secondary | ICD-10-CM | POA: Diagnosis not present

## 2019-10-15 ENCOUNTER — Ambulatory Visit (INDEPENDENT_AMBULATORY_CARE_PROVIDER_SITE_OTHER): Payer: Medicare Other

## 2019-10-15 DIAGNOSIS — J309 Allergic rhinitis, unspecified: Secondary | ICD-10-CM

## 2019-10-24 ENCOUNTER — Ambulatory Visit: Payer: Self-pay | Admitting: *Deleted

## 2019-10-24 ENCOUNTER — Encounter: Payer: Self-pay | Admitting: Allergy & Immunology

## 2019-10-24 ENCOUNTER — Other Ambulatory Visit: Payer: Self-pay

## 2019-10-24 ENCOUNTER — Ambulatory Visit: Payer: Medicare Other | Admitting: Allergy & Immunology

## 2019-10-24 VITALS — BP 112/62 | HR 67 | Temp 97.9°F | Resp 16 | Ht 66.0 in | Wt 164.4 lb

## 2019-10-24 DIAGNOSIS — J4520 Mild intermittent asthma, uncomplicated: Secondary | ICD-10-CM

## 2019-10-24 DIAGNOSIS — J3089 Other allergic rhinitis: Secondary | ICD-10-CM | POA: Diagnosis not present

## 2019-10-24 DIAGNOSIS — J309 Allergic rhinitis, unspecified: Secondary | ICD-10-CM

## 2019-10-24 DIAGNOSIS — J302 Other seasonal allergic rhinitis: Secondary | ICD-10-CM

## 2019-10-24 MED ORDER — EPINEPHRINE 0.3 MG/0.3ML IJ SOAJ
INTRAMUSCULAR | 1 refills | Status: DC
Start: 2019-10-24 — End: 2021-05-27

## 2019-10-24 NOTE — Patient Instructions (Addendum)
1. Mild intermittent asthma, uncomplicated - Lung testing deferred today.  - Continue with ProAir four puffs at needed.  - I think that your allergy shots have truly helped your asthma symptoms.   2. Chronic rhinitis (grasses, weeds, cat, and dust mite) - maintenance reached July 2018 - Continue with allergy shots at the same schedule. - Continue with Nasacort 1-2 sprays per nostril daily as needed.  - Continue with Pataday eye drops daily.   3. Return in about 1 year (around 10/23/2020).    Please inform us of any Emergency Department visits, hospitalizations, or changes in symptoms. Call us before going to the ED for breathing or allergy symptoms since we might be able to fit you in for a sick visit. Feel free to contact us anytime with any questions, problems, or concerns.  It was a pleasure to see you again today!  Websites that have reliable patient information: 1. American Academy of Asthma, Allergy, and Immunology: www.aaaai.org 2. Food Allergy Research and Education (FARE): foodallergy.org 3. Mothers of Asthmatics: http://www.asthmacommunitynetwork.org 4. American College of Allergy, Asthma, and Immunology: www.acaai.org   COVID-19 Vaccine Information can be found at: ShippingScam.co.uk For questions related to vaccine distribution or appointments, please email vaccine@ .com or call [phone].     "Like" [place] on Facebook and Instagram for our latest updates!       Make sure you are registered to vote! If you have moved or changed any of your contact information, you will need to get this updated before voting!  In some cases, you MAY be able to register to vote online: [email or web address]

## 2019-10-24 NOTE — Progress Notes (Signed)
FOLLOW UP  Date of Service/Encounter:  10/24/19   Assessment:   Mild intermittent asthma, uncomplicated  Seasonaland perennialallergic rhinitis(grasses, weeds, cat, and dust mite)  Fully vaccinated (spring 2021) with Moderna   Asthma Reportables: Severity:intermittent Risk:low Control:well controlled  Plan/Recommendations:   1. Mild intermittent asthma, uncomplicated - Lung testing deferred today.  - Continue with ProAir four puffs at needed.  - I think that your allergy shots have truly helped your asthma symptoms.   2. Chronic rhinitis (grasses, weeds, cat, and dust mite) - maintenance reached July 2018 - Continue with allergy shots at the same schedule. - Continue with Nasacort 1-2 sprays per nostril daily as needed.  - Continue with Pataday eye drops daily.   3. Return in about 1 year (around 10/23/2020).   Subjective:   [name] [name] is a 74 y.o. female presenting today for follow up of  Chief Complaint  Patient presents with  . Allergic Rhinitis   . Asthma    [name] [name] has a history of the following: Patient Active Problem List   Diagnosis Date Noted  . ALLERGIC CONJUNCTIVITIS 09/26/2007  . Seasonal and perennial allergic rhinitis 09/26/2007  . Mild intermittent asthma, uncomplicated 41/[id number]/4401    History obtained from: chart review and patient.  [name] [name] is a 74 y.o. female presenting for a follow up visit.  She was last seen in September 2020.  At that time, she was doing very well.  She was reporting that she was not getting 100% relief during the spring season, so we remix her allergen vials to be slightly stronger with regards to the pollens.  For her asthma, her lung testing looked great.  We continue with albuterol as needed.  She remained on nasal steroids as well as eyedrops.  Since last visit, she has done very well. She has had only one cold and no asthma attacks. Her smyptoms were already doing better before COVID19.    Asthma/Respiratory Symptom History: She has not been using her rescue inhaler at all.  She thinks she might have use it once in the past year. Taegan's asthma has been well controlled. She has not required rescue medication, experienced nocturnal awakenings due to lower respiratory symptoms, nor have activities of daily living been limited. She has required no Emergency Department or Urgent Care visits for her asthma. She has required zero courses of systemic steroids for asthma exacerbations since the last visit. ACT score today is 25, indicating excellent asthma symptom control.   Allergic Rhinitis Symptom History: We increased her pollens at the last set of vials and this has helped her. She is using her nasal steroid fairly regularly.  She does use her eyedrops every single day.  She has not needed antihistamines in quite some time, although she does use them on days of her allergy shots.  She has not needed antibiotics in several days.  Otherwise, there have been no changes to her past medical history, surgical history, family history, or social history.    Review of Systems  Constitutional: Negative.  Negative for chills, fever, malaise/fatigue and weight loss.  HENT: Negative for congestion, ear discharge, ear pain and sinus pain.   Eyes: Negative for pain, discharge and redness.  Respiratory: Negative for cough, sputum production, shortness of breath and wheezing.   Cardiovascular: Negative.  Negative for chest pain and palpitations.  Gastrointestinal: Negative for abdominal pain, constipation, diarrhea, heartburn, nausea and vomiting.  Skin: Negative.  Negative for itching and rash.  Neurological: Negative for  dizziness and headaches.  Endo/Heme/Allergies: Positive for environmental allergies. Does not bruise/bleed easily.       Objective:   Blood pressure 112/62, pulse 67, temperature 97.9 F (36.6 C), temperature source Temporal, resp. rate 16, height 5\' 6"  (1.676 m), weight  164 lb 6.4 oz (74.6 kg), SpO2 99 %. Body mass index is 26.53 kg/m.   Physical Exam:  Physical Exam Constitutional:      Appearance: She is well-developed.     Comments: Very friendly female.  Talkative.  HENT:     Head: Normocephalic and atraumatic.     Right Ear: Tympanic membrane, ear canal and external ear normal.     Left Ear: Tympanic membrane and ear canal normal.     Nose: No nasal deformity, septal deviation, mucosal edema or rhinorrhea.     Right Sinus: No maxillary sinus tenderness or frontal sinus tenderness.     Left Sinus: No maxillary sinus tenderness or frontal sinus tenderness.     Mouth/Throat:     Mouth: Mucous membranes are not pale and not dry.     Pharynx: Uvula midline.  Eyes:     General:        Right eye: No discharge.        Left eye: No discharge.     Conjunctiva/sclera: Conjunctivae normal.     Right eye: Right conjunctiva is not injected. No chemosis.    Left eye: Left conjunctiva is not injected. No chemosis.    Pupils: Pupils are equal, round, and reactive to light.  Cardiovascular:     Rate and Rhythm: Normal rate and regular rhythm.     Heart sounds: Normal heart sounds.  Pulmonary:     Effort: Pulmonary effort is normal. No tachypnea, accessory muscle usage or respiratory distress.     Breath sounds: Normal breath sounds. No wheezing, rhonchi or rales.  Chest:     Chest wall: No tenderness.  Lymphadenopathy:     Cervical: No cervical adenopathy.  Skin:    Coloration: Skin is not pale.     Findings: No abrasion, erythema, petechiae or rash. Rash is not papular, urticarial or vesicular.  Neurological:     Mental Status: She is alert.      Diagnostic studies: none        Salvatore Marvel, MD  [facility]

## 2019-10-30 ENCOUNTER — Ambulatory Visit (INDEPENDENT_AMBULATORY_CARE_PROVIDER_SITE_OTHER): Payer: Medicare Other

## 2019-10-30 DIAGNOSIS — J309 Allergic rhinitis, unspecified: Secondary | ICD-10-CM | POA: Diagnosis not present

## 2019-11-06 ENCOUNTER — Ambulatory Visit (INDEPENDENT_AMBULATORY_CARE_PROVIDER_SITE_OTHER): Payer: Medicare Other

## 2019-11-06 DIAGNOSIS — J309 Allergic rhinitis, unspecified: Secondary | ICD-10-CM

## 2019-12-03 ENCOUNTER — Ambulatory Visit (INDEPENDENT_AMBULATORY_CARE_PROVIDER_SITE_OTHER): Payer: Medicare Other

## 2019-12-03 DIAGNOSIS — J309 Allergic rhinitis, unspecified: Secondary | ICD-10-CM

## 2020-01-01 ENCOUNTER — Ambulatory Visit (INDEPENDENT_AMBULATORY_CARE_PROVIDER_SITE_OTHER): Payer: Medicare Other | Admitting: *Deleted

## 2020-01-01 DIAGNOSIS — J309 Allergic rhinitis, unspecified: Secondary | ICD-10-CM

## 2020-01-06 NOTE — Progress Notes (Signed)
EXP 01/07/21 

## 2020-01-09 DIAGNOSIS — J3081 Allergic rhinitis due to animal (cat) (dog) hair and dander: Secondary | ICD-10-CM | POA: Diagnosis not present

## 2020-01-10 DIAGNOSIS — J3089 Other allergic rhinitis: Secondary | ICD-10-CM | POA: Diagnosis not present

## 2020-01-29 ENCOUNTER — Ambulatory Visit (INDEPENDENT_AMBULATORY_CARE_PROVIDER_SITE_OTHER): Payer: Medicare Other

## 2020-01-29 ENCOUNTER — Other Ambulatory Visit: Payer: Self-pay | Admitting: Family Medicine

## 2020-01-29 DIAGNOSIS — Z1231 Encounter for screening mammogram for malignant neoplasm of breast: Secondary | ICD-10-CM

## 2020-01-29 DIAGNOSIS — J309 Allergic rhinitis, unspecified: Secondary | ICD-10-CM

## 2020-02-25 ENCOUNTER — Ambulatory Visit (INDEPENDENT_AMBULATORY_CARE_PROVIDER_SITE_OTHER): Payer: Medicare Other

## 2020-02-25 DIAGNOSIS — J309 Allergic rhinitis, unspecified: Secondary | ICD-10-CM

## 2020-02-27 DIAGNOSIS — N3010 Interstitial cystitis (chronic) without hematuria: Secondary | ICD-10-CM | POA: Diagnosis not present

## 2020-03-09 ENCOUNTER — Ambulatory Visit: Payer: Medicare Other

## 2020-03-15 DIAGNOSIS — Z20822 Contact with and (suspected) exposure to covid-19: Secondary | ICD-10-CM | POA: Diagnosis not present

## 2020-03-18 ENCOUNTER — Ambulatory Visit: Payer: Medicare Other

## 2020-03-20 ENCOUNTER — Ambulatory Visit: Payer: Medicare Other

## 2020-03-20 DIAGNOSIS — B349 Viral infection, unspecified: Secondary | ICD-10-CM | POA: Diagnosis not present

## 2020-03-24 DIAGNOSIS — R5383 Other fatigue: Secondary | ICD-10-CM | POA: Diagnosis not present

## 2020-03-30 ENCOUNTER — Inpatient Hospital Stay (HOSPITAL_COMMUNITY)
Admission: EM | Admit: 2020-03-30 | Discharge: 2020-04-01 | DRG: 872 | Disposition: A | Discharge status: Home / Self Care | Payer: Medicare Other | Attending: Internal Medicine | Admitting: Internal Medicine

## 2020-03-30 ENCOUNTER — Emergency Department (HOSPITAL_COMMUNITY): Payer: Medicare Other

## 2020-03-30 ENCOUNTER — Encounter (HOSPITAL_COMMUNITY): Payer: Self-pay

## 2020-03-30 ENCOUNTER — Other Ambulatory Visit: Payer: Self-pay

## 2020-03-30 DIAGNOSIS — Z79899 Other long term (current) drug therapy: Secondary | ICD-10-CM | POA: Diagnosis not present

## 2020-03-30 DIAGNOSIS — Z8261 Family history of arthritis: Secondary | ICD-10-CM | POA: Diagnosis not present

## 2020-03-30 DIAGNOSIS — J4520 Mild intermittent asthma, uncomplicated: Secondary | ICD-10-CM | POA: Diagnosis not present

## 2020-03-30 DIAGNOSIS — Z7951 Long term (current) use of inhaled steroids: Secondary | ICD-10-CM

## 2020-03-30 DIAGNOSIS — I10 Essential (primary) hypertension: Secondary | ICD-10-CM | POA: Diagnosis present

## 2020-03-30 DIAGNOSIS — R509 Fever, unspecified: Secondary | ICD-10-CM | POA: Diagnosis not present

## 2020-03-30 DIAGNOSIS — R531 Weakness: Secondary | ICD-10-CM

## 2020-03-30 DIAGNOSIS — N179 Acute kidney failure, unspecified: Secondary | ICD-10-CM | POA: Diagnosis not present

## 2020-03-30 DIAGNOSIS — D649 Anemia, unspecified: Secondary | ICD-10-CM | POA: Diagnosis not present

## 2020-03-30 DIAGNOSIS — Z803 Family history of malignant neoplasm of breast: Secondary | ICD-10-CM | POA: Diagnosis not present

## 2020-03-30 DIAGNOSIS — N390 Urinary tract infection, site not specified: Secondary | ICD-10-CM | POA: Diagnosis not present

## 2020-03-30 DIAGNOSIS — E785 Hyperlipidemia, unspecified: Secondary | ICD-10-CM | POA: Diagnosis not present

## 2020-03-30 DIAGNOSIS — Z825 Family history of asthma and other chronic lower respiratory diseases: Secondary | ICD-10-CM

## 2020-03-30 DIAGNOSIS — Z8249 Family history of ischemic heart disease and other diseases of the circulatory system: Secondary | ICD-10-CM

## 2020-03-30 DIAGNOSIS — E860 Dehydration: Secondary | ICD-10-CM | POA: Diagnosis present

## 2020-03-30 DIAGNOSIS — N3010 Interstitial cystitis (chronic) without hematuria: Secondary | ICD-10-CM | POA: Diagnosis not present

## 2020-03-30 DIAGNOSIS — Z833 Family history of diabetes mellitus: Secondary | ICD-10-CM | POA: Diagnosis not present

## 2020-03-30 DIAGNOSIS — A419 Sepsis, unspecified organism: Principal | ICD-10-CM | POA: Diagnosis present

## 2020-03-30 DIAGNOSIS — Z20822 Contact with and (suspected) exposure to covid-19: Secondary | ICD-10-CM | POA: Diagnosis present

## 2020-03-30 DIAGNOSIS — D638 Anemia in other chronic diseases classified elsewhere: Secondary | ICD-10-CM | POA: Diagnosis not present

## 2020-03-30 DIAGNOSIS — I959 Hypotension, unspecified: Secondary | ICD-10-CM | POA: Diagnosis not present

## 2020-03-30 DIAGNOSIS — R000 Tachycardia, unspecified: Secondary | ICD-10-CM | POA: Diagnosis not present

## 2020-03-30 DIAGNOSIS — J9811 Atelectasis: Secondary | ICD-10-CM | POA: Diagnosis not present

## 2020-03-30 LAB — BASIC METABOLIC PANEL
Anion gap: 9 (ref 5–15)
BUN: 20 mg/dL (ref 8–23)
CO2: 24 mmol/L (ref 22–32)
Calcium: 9 mg/dL (ref 8.9–10.3)
Chloride: 102 mmol/L (ref 98–111)
Creatinine, Ser: 1.68 mg/dL — ABNORMAL HIGH (ref 0.44–1.00)
GFR, Estimated: 32 mL/min — ABNORMAL LOW (ref >60)
Glucose, Bld: 135 mg/dL — ABNORMAL HIGH (ref 70–99)
Potassium: 5.4 mmol/L — ABNORMAL HIGH (ref 3.5–5.1)
Sodium: 135 mmol/L (ref 135–145)

## 2020-03-30 LAB — URINALYSIS, ROUTINE W REFLEX MICROSCOPIC
Bilirubin Urine: NEGATIVE
Glucose, UA: NEGATIVE mg/dL
Hgb urine dipstick: NEGATIVE
Ketones, ur: 5 mg/dL — AB
Nitrite: NEGATIVE
Protein, ur: 30 mg/dL — AB
Specific Gravity, Urine: 1.023 (ref 1.005–1.030)
WBC, UA: >50 WBC/hpf — ABNORMAL HIGH (ref 0–5)
pH: 5 (ref 5.0–8.0)

## 2020-03-30 LAB — CBG MONITORING, ED: Glucose-Capillary: 121 mg/dL — ABNORMAL HIGH (ref 70–99)

## 2020-03-30 LAB — CBC
HCT: 34.1 % — ABNORMAL LOW (ref 36.0–46.0)
Hemoglobin: 11 g/dL — ABNORMAL LOW (ref 12.0–15.0)
MCH: 30.1 pg (ref 26.0–34.0)
MCHC: 32.3 g/dL (ref 30.0–36.0)
MCV: 93.4 fL (ref 80.0–100.0)
Platelets: 379 10*3/uL (ref 150–400)
RBC: 3.65 MIL/uL — ABNORMAL LOW (ref 3.87–5.11)
RDW: 13.8 % (ref 11.5–15.5)
WBC: 16.6 10*3/uL — ABNORMAL HIGH (ref 4.0–10.5)
nRBC: 0 % (ref 0.0–0.2)

## 2020-03-30 MED ORDER — SODIUM CHLORIDE 0.9 % IV BOLUS (SEPSIS)
1000.0000 mL | Freq: Once | INTRAVENOUS | Status: AC
  Administered 2020-03-31: 1000 mL via INTRAVENOUS

## 2020-03-30 MED ORDER — ALUM & MAG HYDROXIDE-SIMETH 200-200-20 MG/5ML PO SUSP
30.0000 mL | Freq: Once | ORAL | Status: AC
  Administered 2020-03-31: 30 mL via ORAL
  Filled 2020-03-30: qty 30

## 2020-03-30 MED ORDER — SODIUM CHLORIDE 0.9 % IV SOLN
1.0000 g | INTRAVENOUS | Status: DC
  Administered 2020-03-31 (×2): 1 g via INTRAVENOUS
  Filled 2020-03-30: qty 1
  Filled 2020-03-30: qty 10

## 2020-03-30 MED ORDER — SODIUM CHLORIDE 0.9 % IV BOLUS (SEPSIS)
250.0000 mL | Freq: Once | INTRAVENOUS | Status: AC
  Administered 2020-03-31: 250 mL via INTRAVENOUS

## 2020-03-30 NOTE — ED Provider Notes (Signed)
[facility] DEPT Provider Note   CSN: [id number] Arrival date & time: 03/30/20  1551     History Chief Complaint  Patient presents with  . Hypotension    [name] [name] is a 75 y.o. female.  HPI      2/1 felt sick, very tired, that day had chest pain for less than 15 minutes and resolved The next day had fever 100.5, then for the next 2 days was very weak, couldn't walk, then improved, then the next day had headache and sore throat, diarrhea, vomiting x2 then improved. Everything improved following these waxing and waning symptoms with the exception of fatigue.  Has not had these symptoms since around 2/10 Went to clinic today NP and had vitamin infusion, saline, vitamin B12. Had blood work done at PCP previously.  87/52 at clinic today. Threw up today three times No diarrhea or constipation No abdominal pain No dysuria, no other side or flank pain (had previously) No fevers (100.3 today)   Interstitial Cystitis-MR can be performed under certain conditions, has card    Past Medical History:  Diagnosis Date  . Allergic conjunctivitis   . Allergic rhinitis   . Asthma     Patient Active Problem List   Diagnosis Date Noted  . ALLERGIC CONJUNCTIVITIS 09/26/2007  . Seasonal and perennial allergic rhinitis 09/26/2007  . Mild intermittent asthma, uncomplicated 49/17/9150    Past Surgical History:  Procedure Laterality Date  . FOOT SURGERY    . INSERTION / PLACEMENT / REVISION NEUROSTIMULATOR  02/2017   bladder stimulator  . TONSILLECTOMY       OB History   No obstetric history on file.     Family History  Problem Relation Age of Onset  . Allergies Mother   . Asthma Mother   . Heart disease Mother   . Arthritis Mother   . Diabetes Father   . Allergies Father   . Heart disease Father   . Breast cancer Sister 53  . Allergic rhinitis Neg Hx   . Angioedema Neg Hx   . Eczema Neg Hx   . Immunodeficiency Neg Hx   . Urticaria  Neg Hx     Social History   Tobacco Use  . Smoking status: Never Smoker  . Smokeless tobacco: Never Used  Vaping Use  . Vaping Use: Never used  Substance Use Topics  . Alcohol use: Yes  . Drug use: No    Home Medications Prior to Admission medications   Medication Sig Start Date End Date Taking? Authorizing Provider  albuterol (PROAIR HFA) 108 (90 Base) MCG/ACT inhaler Inhale 2 puffs into the lungs every 4 (four) hours as needed for wheezing or shortness of breath. 10/30/17  Yes Valentina Shaggy, MD  alendronate (FOSAMAX) 70 MG tablet Take 70 mg by mouth once a week. Sunday 08/21/19  Yes [provider]  amitriptyline (ELAVIL) 10 MG tablet Take 10 mg by mouth at bedtime. 03/17/20  Yes [provider]  atorvastatin (LIPITOR) 10 MG tablet Take 10 mg by mouth daily. 12/09/14  Yes [provider]  Black Elderberry,Berry-Flower, 575 MG CAPS Take 2 capsules by mouth daily.   Yes [provider]  Calcium 500-125 MG-UNIT TABS Take 2 tablets by mouth daily.   Yes [provider]  cetirizine (ZYRTEC) 10 MG tablet Take 10 mg by mouth daily.   Yes [provider]  EPINEPHrine 0.3 mg/0.3 mL IJ SOAJ injection Inject into thigh for severe allergic reaction 10/24/19  Yes Valentina Shaggy, MD  lisinopril (PRINIVIL,ZESTRIL) 5 MG tablet Take 5 mg by mouth daily. 02/09/15  Yes [provider]  Methylcobalamin (B-12) 5000 MCG TBDP Take 1 tablet by mouth daily.   Yes [provider]  Multiple Vitamin (MULTIVITAMIN) capsule Take 1 capsule by mouth daily.   Yes [provider]  olopatadine (PATANOL) 0.1 % ophthalmic solution Place 1 drop into both eyes in the morning and at bedtime.   Yes [provider]  [place]   Yes [provider]  solifenacin (VESICARE) 5 MG tablet Take 5 mg by mouth daily. 10/15/19  Yes [provider]  Triamcinolone Acetonide (NASACORT AQ NA) Place  2 puffs into the nose daily.   Yes [provider]  NONFORMULARY OR COMPOUNDED ITEM Allergy Vaccine 1:10 Given at Home    [provider]    Allergies    Patient has no known allergies.  Review of Systems   Review of Systems  Constitutional: Positive for activity change, fatigue and fever.  HENT: Negative for congestion and sore throat.   Respiratory: Positive for cough (last few days, mild). Negative for shortness of breath.   Cardiovascular: Negative for chest pain.  Gastrointestinal: Positive for nausea and vomiting. Negative for abdominal pain, constipation and diarrhea.  Genitourinary: Negative for difficulty urinating and dysuria.  Musculoskeletal: Negative for back pain.  Skin: Negative for rash.  Neurological: Negative for headaches.    Physical Exam Updated Vital Signs BP (!) 126/53   Pulse 80   Temp 98.7 F (37.1 C) (Oral)   Resp 19   Ht 5' 6.5" (1.689 m)   Wt 73 kg   SpO2 97%   BMI 25.60 kg/m   Physical Exam Vitals and nursing note reviewed.  Constitutional:      General: She is not in acute distress.    Appearance: She is well-developed and well-nourished. She is not diaphoretic.  HENT:     [name]: Normocephalic and atraumatic.  Eyes:     Extraocular Movements: EOM normal.     Conjunctiva/sclera: Conjunctivae normal.  Cardiovascular:     Rate and Rhythm: Normal rate and regular rhythm.     Pulses: Intact distal pulses.     Heart sounds: Normal heart sounds. No murmur heard. No friction rub. No gallop.   Pulmonary:     Effort: Pulmonary effort is normal. No respiratory distress.     Breath sounds: Normal breath sounds. No wheezing or rales.  Abdominal:     General: There is no distension.     Palpations: Abdomen is soft.     Tenderness: There is no abdominal tenderness. There is no guarding.  Musculoskeletal:        General: No tenderness or edema.     Cervical back: Normal range of motion.  Skin:    General: Skin is warm and  dry.     Findings: No erythema or rash.  Neurological:     Mental Status: She is alert and oriented to person, place, and time.     ED Results / Procedures / Treatments   Labs (all labs ordered are listed, but only abnormal results are displayed) Labs Reviewed  BASIC METABOLIC PANEL - Abnormal; Notable for the following components:      Result Value   Potassium 5.4 (*)    Glucose, Bld 135 (*)    Creatinine, Ser 1.68 (*)    GFR, Estimated 32 (*)    All other components within normal  limits  CBC - Abnormal; Notable for the following components:   WBC 16.6 (*)    RBC 3.65 (*)    Hemoglobin 11.0 (*)    HCT 34.1 (*)    All other components within normal limits  URINALYSIS, ROUTINE W REFLEX MICROSCOPIC - Abnormal; Notable for the following components:   Color, Urine AMBER (*)    APPearance CLOUDY (*)    Ketones, ur 5 (*)    Protein, ur 30 (*)    Leukocytes,Ua MODERATE (*)    WBC, UA >50 (*)    Bacteria, UA MANY (*)    All other components within normal limits  CBG MONITORING, ED - Abnormal; Notable for the following components:   Glucose-Capillary 121 (*)    All other components within normal limits  RESP PANEL BY RT-PCR (FLU A&B, COVID) ARPGX2  CULTURE, BLOOD (ROUTINE X 2)  CULTURE, BLOOD (ROUTINE X 2)  URINE CULTURE  PROTIME-INR  APTT  LACTIC ACID, PLASMA  LACTIC ACID, PLASMA  COMPREHENSIVE METABOLIC PANEL  TROPONIN I (HIGH SENSITIVITY)    EKG EKG Interpretation  Date/Time:  Monday March 30 2020 16:37:43 EST Ventricular Rate:  101 PR Interval:    QRS Duration: 91 QT Interval:  350 QTC Calculation: 454 R Axis:   93 Text Interpretation: Sinus tachycardia Right axis deviation Low voltage, precordial leads 12 Lead; [place] SINCE LAST TRACING HEART RATE HAS INCREASED Confirmed by Gareth Morgan [phone]) on 03/30/2020 11:42:06 PM   Radiology DG Chest Port 1 View  Result Date: 03/30/2020 CLINICAL DATA:  Questionable sepsis, weakness for 1 week, low-grade  fever EXAM: PORTABLE CHEST 1 VIEW COMPARISON:  None. FINDINGS: Mild airways thickening. Some additional streaky opacity in the bases possibly related atelectasis given some low lung volumes. No consolidative process. No pneumothorax or effusion. The aorta is calcified. The remaining cardiomediastinal contours are unremarkable. No acute osseous or soft tissue abnormality. IMPRESSION: Mild basilar airways thickening, could reflect bronchitis or reactive airways disease. Some additional streaky opacities favoring atelectasis given low volumes. Electronically Signed   By: Lovena Le M.D.   On: 03/30/2020 23:24    Procedures Procedures   Medications Ordered in ED Medications  sodium chloride 0.9 % bolus 1,000 mL (1,000 mLs Intravenous New Bag/Given 03/31/20 0017)    And  sodium chloride 0.9 % bolus 1,000 mL (1,000 mLs Intravenous New Bag/Given 03/31/20 0016)    And  sodium chloride 0.9 % bolus 250 mL (has no administration in time range)  cefTRIAXone (ROCEPHIN) 1 g in sodium chloride 0.9 % 100 mL IVPB (1 g Intravenous New Bag/Given 03/31/20 0017)  alum & mag hydroxide-simeth (MAALOX/MYLANTA) 200-200-20 MG/5ML suspension 30 mL (30 mLs Oral Given 03/31/20 0016)    ED Course  I have reviewed the triage vital signs and the nursing notes.  Pertinent labs & imaging results that were available during my care of the patient were reviewed by me and considered in my medical decision making (see chart for details).    MDM Rules/Calculators/A&P                          75yo female with history of allergies and asthma presents with concern for generalized weakness, fatigue, nausea and vomiting.  On arrival temperature 100.3, heart rate 101, blood pressure 97/68 with decrease to 90/45 and report of BP in 80s at outpatient infusion clinic today.   Urinalysis consistent with urinary tract infection, CBC significant for leukocytosis, and creatinine 1.68 (she reports recent GFR  55 with concern for AKI at  [facility]  Given constellation of findings I have concern for sepsis secondary to urinary source.  Ordered 30cc/kg NS and rocephin. Blood pressures improving.    Final Clinical Impression(s) / ED Diagnoses Final diagnoses:  Sepsis, due to unspecified organism, unspecified whether acute organ dysfunction present [facility])  Urinary tract infection without hematuria, site unspecified    Rx / DC Orders ED Discharge Orders    None       Gareth Morgan, MD 03/31/20 0020

## 2020-03-30 NOTE — ED Triage Notes (Signed)
Pt arrived via walk in, c/o n/v diarrhea, fever chills, x1 week all resolved except for increasing weakness. Was seen at infusion clinic today. Told Bp was low and to follow with ED

## 2020-03-30 NOTE — Progress Notes (Signed)
Elink following for Sepsis Protocol 

## 2020-03-30 NOTE — ED Notes (Signed)
Pt reports that the infusion that she received earlier today was a vitamin infusion of vitamin K, C, and B12.

## 2020-03-31 DIAGNOSIS — E860 Dehydration: Secondary | ICD-10-CM | POA: Diagnosis present

## 2020-03-31 DIAGNOSIS — Z8261 Family history of arthritis: Secondary | ICD-10-CM | POA: Diagnosis not present

## 2020-03-31 DIAGNOSIS — Z8249 Family history of ischemic heart disease and other diseases of the circulatory system: Secondary | ICD-10-CM | POA: Diagnosis not present

## 2020-03-31 DIAGNOSIS — D649 Anemia, unspecified: Secondary | ICD-10-CM | POA: Diagnosis present

## 2020-03-31 DIAGNOSIS — Z803 Family history of malignant neoplasm of breast: Secondary | ICD-10-CM | POA: Diagnosis not present

## 2020-03-31 DIAGNOSIS — Z833 Family history of diabetes mellitus: Secondary | ICD-10-CM | POA: Diagnosis not present

## 2020-03-31 DIAGNOSIS — N390 Urinary tract infection, site not specified: Secondary | ICD-10-CM | POA: Diagnosis not present

## 2020-03-31 DIAGNOSIS — I10 Essential (primary) hypertension: Secondary | ICD-10-CM | POA: Diagnosis present

## 2020-03-31 DIAGNOSIS — R531 Weakness: Secondary | ICD-10-CM

## 2020-03-31 DIAGNOSIS — I959 Hypotension, unspecified: Secondary | ICD-10-CM | POA: Diagnosis present

## 2020-03-31 DIAGNOSIS — J4520 Mild intermittent asthma, uncomplicated: Secondary | ICD-10-CM | POA: Diagnosis present

## 2020-03-31 DIAGNOSIS — E785 Hyperlipidemia, unspecified: Secondary | ICD-10-CM | POA: Diagnosis present

## 2020-03-31 DIAGNOSIS — N179 Acute kidney failure, unspecified: Secondary | ICD-10-CM

## 2020-03-31 DIAGNOSIS — N3010 Interstitial cystitis (chronic) without hematuria: Secondary | ICD-10-CM | POA: Diagnosis present

## 2020-03-31 DIAGNOSIS — Z825 Family history of asthma and other chronic lower respiratory diseases: Secondary | ICD-10-CM | POA: Diagnosis not present

## 2020-03-31 DIAGNOSIS — A419 Sepsis, unspecified organism: Secondary | ICD-10-CM | POA: Diagnosis present

## 2020-03-31 DIAGNOSIS — D638 Anemia in other chronic diseases classified elsewhere: Secondary | ICD-10-CM | POA: Diagnosis present

## 2020-03-31 DIAGNOSIS — Z7951 Long term (current) use of inhaled steroids: Secondary | ICD-10-CM | POA: Diagnosis not present

## 2020-03-31 DIAGNOSIS — Z79899 Other long term (current) drug therapy: Secondary | ICD-10-CM | POA: Diagnosis not present

## 2020-03-31 DIAGNOSIS — Z20822 Contact with and (suspected) exposure to covid-19: Secondary | ICD-10-CM | POA: Diagnosis present

## 2020-03-31 LAB — BASIC METABOLIC PANEL
Anion gap: 11 (ref 5–15)
BUN: 21 mg/dL (ref 8–23)
CO2: 22 mmol/L (ref 22–32)
Calcium: 8.8 mg/dL — ABNORMAL LOW (ref 8.9–10.3)
Chloride: 102 mmol/L (ref 98–111)
Creatinine, Ser: 1.53 mg/dL — ABNORMAL HIGH (ref 0.44–1.00)
GFR, Estimated: 36 mL/min — ABNORMAL LOW (ref >60)
Glucose, Bld: 110 mg/dL — ABNORMAL HIGH (ref 70–99)
Potassium: 4.3 mmol/L (ref 3.5–5.1)
Sodium: 135 mmol/L (ref 135–145)

## 2020-03-31 LAB — RESP PANEL BY RT-PCR (FLU A&B, COVID) ARPGX2
Influenza A by PCR: NEGATIVE
Influenza B by PCR: NEGATIVE
SARS Coronavirus 2 by RT PCR: NEGATIVE

## 2020-03-31 LAB — APTT: aPTT: 34 seconds (ref 24–36)

## 2020-03-31 LAB — COMPREHENSIVE METABOLIC PANEL
ALT: 20 U/L (ref 0–44)
AST: 16 U/L (ref 15–41)
Albumin: 3 g/dL — ABNORMAL LOW (ref 3.5–5.0)
Alkaline Phosphatase: 108 U/L (ref 38–126)
Anion gap: 10 (ref 5–15)
BUN: 20 mg/dL (ref 8–23)
CO2: 23 mmol/L (ref 22–32)
Calcium: 8.7 mg/dL — ABNORMAL LOW (ref 8.9–10.3)
Chloride: 103 mmol/L (ref 98–111)
Creatinine, Ser: 1.43 mg/dL — ABNORMAL HIGH (ref 0.44–1.00)
GFR, Estimated: 39 mL/min — ABNORMAL LOW (ref >60)
Glucose, Bld: 117 mg/dL — ABNORMAL HIGH (ref 70–99)
Potassium: 4.2 mmol/L (ref 3.5–5.1)
Sodium: 136 mmol/L (ref 135–145)
Total Bilirubin: 0.6 mg/dL (ref 0.3–1.2)
Total Protein: 6.8 g/dL (ref 6.5–8.1)

## 2020-03-31 LAB — CBC
HCT: 28.7 % — ABNORMAL LOW (ref 36.0–46.0)
Hemoglobin: 9.1 g/dL — ABNORMAL LOW (ref 12.0–15.0)
MCH: 30.3 pg (ref 26.0–34.0)
MCHC: 31.7 g/dL (ref 30.0–36.0)
MCV: 95.7 fL (ref 80.0–100.0)
Platelets: 300 10*3/uL (ref 150–400)
RBC: 3 MIL/uL — ABNORMAL LOW (ref 3.87–5.11)
RDW: 14 % (ref 11.5–15.5)
WBC: 10.7 10*3/uL — ABNORMAL HIGH (ref 4.0–10.5)
nRBC: 0 % (ref 0.0–0.2)

## 2020-03-31 LAB — IRON AND TIBC
Iron: 22 ug/dL — ABNORMAL LOW (ref 28–170)
Saturation Ratios: 14 % (ref 10.4–31.8)
TIBC: 157 ug/dL — ABNORMAL LOW (ref 250–450)
UIBC: 135 ug/dL

## 2020-03-31 LAB — PROTIME-INR
INR: 1.1 (ref 0.8–1.2)
Prothrombin Time: 14 seconds (ref 11.4–15.2)

## 2020-03-31 LAB — RETICULOCYTES
Immature Retic Fract: 14.7 % (ref 2.3–15.9)
RBC.: 2.97 MIL/uL — ABNORMAL LOW (ref 3.87–5.11)
Retic Count, Absolute: 26.4 10*3/uL (ref 19.0–186.0)
Retic Ct Pct: 0.9 % (ref 0.4–3.1)

## 2020-03-31 LAB — FERRITIN: Ferritin: 219 ng/mL (ref 11–307)

## 2020-03-31 LAB — TROPONIN I (HIGH SENSITIVITY)
Troponin I (High Sensitivity): 6 ng/L (ref <18)
Troponin I (High Sensitivity): 4 ng/L (ref <18)

## 2020-03-31 LAB — VITAMIN B12: Vitamin B-12: >7500 pg/mL — ABNORMAL HIGH (ref 180–914)

## 2020-03-31 LAB — FOLATE: Folate: 16.4 ng/mL (ref >5.9)

## 2020-03-31 LAB — LACTIC ACID, PLASMA: Lactic Acid, Venous: 1 mmol/L (ref 0.5–1.9)

## 2020-03-31 MED ORDER — B-12 5000 MCG PO TBDP: 1.0000 | ORAL_TABLET | Freq: Every day | ORAL | Status: DC

## 2020-03-31 MED ORDER — ADULT MULTIVITAMIN W/MINERALS CH
1.0000 | ORAL_TABLET | Freq: Every day | ORAL | Status: DC
  Administered 2020-03-31 – 2020-04-01 (×2): 1 via ORAL
  Filled 2020-03-31 (×2): qty 1

## 2020-03-31 MED ORDER — OLOPATADINE HCL 0.1 % OP SOLN
1.0000 [drp] | Freq: Two times a day (BID) | OPHTHALMIC | Status: DC
  Administered 2020-03-31 – 2020-04-01 (×3): 1 [drp] via OPHTHALMIC
  Filled 2020-03-31: qty 5

## 2020-03-31 MED ORDER — LORATADINE 10 MG PO TABS
10.0000 mg | ORAL_TABLET | Freq: Every day | ORAL | Status: DC
  Administered 2020-03-31 – 2020-04-01 (×2): 10 mg via ORAL
  Filled 2020-03-31 (×2): qty 1

## 2020-03-31 MED ORDER — ENOXAPARIN SODIUM 40 MG/0.4ML ~~LOC~~ SOLN
40.0000 mg | SUBCUTANEOUS | Status: DC
  Administered 2020-03-31 – 2020-04-01 (×2): 40 mg via SUBCUTANEOUS
  Filled 2020-03-31 (×2): qty 0.4

## 2020-03-31 MED ORDER — VITAMIN B-12 1000 MCG PO TABS
5000.0000 ug | ORAL_TABLET | Freq: Every day | ORAL | Status: DC
  Administered 2020-03-31 – 2020-04-01 (×2): 5000 ug via ORAL
  Filled 2020-03-31 (×2): qty 5

## 2020-03-31 MED ORDER — ATORVASTATIN CALCIUM 10 MG PO TABS
10.0000 mg | ORAL_TABLET | Freq: Every day | ORAL | Status: DC
  Administered 2020-03-31 – 2020-04-01 (×2): 10 mg via ORAL
  Filled 2020-03-31 (×2): qty 1

## 2020-03-31 MED ORDER — AMITRIPTYLINE HCL 10 MG PO TABS
10.0000 mg | ORAL_TABLET | Freq: Every day | ORAL | Status: DC
  Administered 2020-03-31: 10 mg via ORAL
  Filled 2020-03-31: qty 1

## 2020-03-31 MED ORDER — ACETAMINOPHEN 650 MG RE SUPP: 650.0000 mg | Freq: Four times a day (QID) | RECTAL | Status: DC | PRN

## 2020-03-31 MED ORDER — SODIUM CHLORIDE 0.9 % IV SOLN: INTRAVENOUS | Status: AC

## 2020-03-31 MED ORDER — ALBUTEROL SULFATE HFA 108 (90 BASE) MCG/ACT IN AERS: 2.0000 | INHALATION_SPRAY | RESPIRATORY_TRACT | Status: DC | PRN

## 2020-03-31 MED ORDER — ACETAMINOPHEN 325 MG PO TABS: 650.0000 mg | ORAL_TABLET | Freq: Four times a day (QID) | ORAL | Status: DC | PRN

## 2020-03-31 NOTE — H&P (Signed)
History and Physical    [name] [name] GGY:[id number] DOB: 01-Jul-1945 DOA: 03/30/2020  PCP: Kathyrn Lass, MD Patient coming from: Home  Chief Complaint: Generalized weakness  HPI: [name] [name] is a 75 y.o. female with medical history significant of asthma, hypertension, hyperlipidemia presented to the ED with complaint generalized weakness.  Reports history of symptoms of an acute viral illness such as fever, headaches, weakness about 3 weeks ago and states she was tested for Covid at that time and it was negative.  She is fully vaccinated against COVID.  Patient states all those symptoms have now resolved.  However, she continues to have generalized weakness and malaise for the past 2 weeks.  She did vomit 3 consecutive times today but it was not food, just a small amount of water.  She is no longer nauseous and wants to eat.  Denies abdominal pain.  Reports having chronic urinary frequency/urgency due to history of interstitial cystitis.  She has not had any dysuria.  No other complaints.  Denies fevers, cough, or shortness of breath.  ED Course: Febrile, mildly tachycardic, and blood pressure low with systolic in the 03J on arrival.  Labs showing WBC 16.6, hemoglobin 11.0, MCV 93.4, platelet count 379K.  Sodium 135, potassium 5.4, chloride 102, bicarb 24, BUN 20, creatinine 1.6, glucose 135.  LFTs normal.  Repeat metabolic panel showing potassium 4.2 and creatinine 1.4.  UA with moderate amount of leukocytes, greater than 50 WBCs, and many bacteria.  High-sensitivity troponin negative.  Lactic acid normal.  INR 1.1.  Blood culture x2 pending.  Urine culture pending. SARS-CoV-2 PCR test negative.  Influenza panel negative.  Chest x-ray showing mild bibasilar airway thickening, could reflect bronchitis or reactive airway disease.  Showing some additional streaky opacities felt to be atelectasis given low lung volumes. Patient was given ceftriaxone and 30 cc/kg fluid boluses per sepsis  protocol.  Review of Systems:  All systems reviewed and apart from history of presenting illness, are negative.  Past Medical History:  Diagnosis Date  . Allergic conjunctivitis   . Allergic rhinitis   . Asthma     Past Surgical History:  Procedure Laterality Date  . FOOT SURGERY    . INSERTION / PLACEMENT / REVISION NEUROSTIMULATOR  02/2017   bladder stimulator  . TONSILLECTOMY       reports that she has never smoked. She has never used smokeless tobacco. She reports current alcohol use. She reports that she does not use drugs.  No Known Allergies  Family History  Problem Relation Age of Onset  . Allergies Mother   . Asthma Mother   . Heart disease Mother   . Arthritis Mother   . Diabetes Father   . Allergies Father   . Heart disease Father   . Breast cancer Sister 61  . Allergic rhinitis Neg Hx   . Angioedema Neg Hx   . Eczema Neg Hx   . Immunodeficiency Neg Hx   . Urticaria Neg Hx     Prior to Admission medications   Medication Sig Start Date End Date Taking? Authorizing Provider  albuterol (PROAIR HFA) 108 (90 Base) MCG/ACT inhaler Inhale 2 puffs into the lungs every 4 (four) hours as needed for wheezing or shortness of breath. 10/30/17  Yes Valentina Shaggy, MD  alendronate (FOSAMAX) 70 MG tablet Take 70 mg by mouth once a week. Sunday 08/21/19  Yes [provider]  amitriptyline (ELAVIL) 10 MG tablet Take 10 mg by mouth at bedtime. 03/17/20  Yes [provider]  atorvastatin (LIPITOR) 10 MG tablet Take 10 mg by mouth daily. 12/09/14  Yes [provider]  Black Elderberry,Berry-Flower, 575 MG CAPS Take 2 capsules by mouth daily.   Yes [provider]  Calcium 500-125 MG-UNIT TABS Take 2 tablets by mouth daily.   Yes [provider]  cetirizine (ZYRTEC) 10 MG tablet Take 10 mg by mouth daily.   Yes [provider]  EPINEPHrine 0.3 mg/0.3 mL IJ SOAJ injection Inject into thigh for severe allergic reaction  10/24/19  Yes Valentina Shaggy, MD  lisinopril (PRINIVIL,ZESTRIL) 5 MG tablet Take 5 mg by mouth daily. 02/09/15  Yes [provider]  Methylcobalamin (B-12) 5000 MCG TBDP Take 1 tablet by mouth daily.   Yes [provider]  Multiple Vitamin (MULTIVITAMIN) capsule Take 1 capsule by mouth daily.   Yes [provider]  olopatadine (PATANOL) 0.1 % ophthalmic solution Place 1 drop into both eyes in the morning and at bedtime.   Yes [provider]  [place]   Yes [provider]  solifenacin (VESICARE) 5 MG tablet Take 5 mg by mouth daily. 10/15/19  Yes [provider]  Triamcinolone Acetonide (NASACORT AQ NA) Place 2 puffs into the nose daily.   Yes [provider]  NONFORMULARY OR COMPOUNDED ITEM Allergy Vaccine 1:10 Given at Home    [provider]    Physical Exam: Vitals:   03/31/20 0300 03/31/20 0315 03/31/20 0330 03/31/20 0402  BP: (!) 109/52 (!) 108/54 (!) 104/53 (!) 109/49  Pulse: 73 71 73 73  Resp: (!) 22 (!) 21 17 20   Temp:    98 F (36.7 C)  TempSrc:      SpO2: 95% 96% 94% 95%  Weight:      Height:        Physical Exam Constitutional:      General: She is not in acute distress. HENT:     Head: Normocephalic and atraumatic.     Mouth/Throat:     Mouth: Mucous membranes are dry.  Eyes:     Extraocular Movements: Extraocular movements intact.     Conjunctiva/sclera: Conjunctivae normal.  Cardiovascular:     Rate and Rhythm: Normal rate and regular rhythm.     Pulses: Normal pulses.  Pulmonary:     Effort: Pulmonary effort is normal. No respiratory distress.     Breath sounds: Normal breath sounds. No wheezing or rales.  Abdominal:     General: Bowel sounds are normal. There is no distension.     Palpations: Abdomen is soft.     Tenderness: There is no abdominal tenderness. There is no guarding.  Musculoskeletal:        General: No swelling or tenderness.      Cervical back: Normal range of motion and neck supple.  Skin:    General: Skin is warm and dry.  Neurological:     General: No focal deficit present.     Mental Status: She is alert and oriented to person, place, and time.     Labs on Admission: I have personally reviewed following labs and imaging studies  CBC: Recent Labs  Lab 03/30/20 1713  WBC 16.6*  HGB 11.0*  HCT 34.1*  MCV 93.4  PLT 867   Basic Metabolic Panel: Recent Labs  Lab 03/30/20 1713 03/30/20 2340  NA 135 136  K 5.4* 4.2  CL 102 103  CO2 24 23  GLUCOSE 135* 117*  BUN 20  20  CREATININE 1.68* 1.43*  CALCIUM 9.0 8.7*   GFR: Estimated Creatinine Clearance: 36.8 mL/min (A) (by C-G formula based on SCr of 1.43 mg/dL (H)). Liver Function Tests: Recent Labs  Lab 03/30/20 2340  AST 16  ALT 20  ALKPHOS 108  BILITOT 0.6  PROT 6.8  ALBUMIN 3.0*   No results for input(s): LIPASE, AMYLASE in the last 168 hours. No results for input(s): AMMONIA in the last 168 hours. Coagulation Profile: Recent Labs  Lab 03/30/20 2340  INR 1.1   Cardiac Enzymes: No results for input(s): CKTOTAL, CKMB, CKMBINDEX, TROPONINI in the last 168 hours. BNP (last 3 results) No results for input(s): PROBNP in the last 8760 hours. HbA1C: No results for input(s): HGBA1C in the last 72 hours. CBG: Recent Labs  Lab 03/30/20 1634  GLUCAP 121*   Lipid Profile: No results for input(s): CHOL, HDL, LDLCALC, TRIG, CHOLHDL, LDLDIRECT in the last 72 hours. Thyroid Function Tests: No results for input(s): TSH, T4TOTAL, FREET4, T3FREE, THYROIDAB in the last 72 hours. Anemia Panel: No results for input(s): VITAMINB12, FOLATE, FERRITIN, TIBC, IRON, RETICCTPCT in the last 72 hours. Urine analysis:    Component Value Date/Time   COLORURINE AMBER (A) 03/30/2020 1800   APPEARANCEUR CLOUDY (A) 03/30/2020 1800   LABSPEC 1.023 03/30/2020 1800   PHURINE 5.0 03/30/2020 1800   GLUCOSEU NEGATIVE 03/30/2020 1800   HGBUR NEGATIVE 03/30/2020  1800   BILIRUBINUR NEGATIVE 03/30/2020 1800   KETONESUR 5 (A) 03/30/2020 1800   PROTEINUR 30 (A) 03/30/2020 1800   NITRITE NEGATIVE 03/30/2020 1800   LEUKOCYTESUR MODERATE (A) 03/30/2020 1800    Radiological Exams on Admission: DG Chest Port 1 View  Result Date: 03/30/2020 CLINICAL DATA:  Questionable sepsis, weakness for 1 week, low-grade fever EXAM: PORTABLE CHEST 1 VIEW COMPARISON:  None. FINDINGS: Mild airways thickening. Some additional streaky opacity in the bases possibly related atelectasis given some low lung volumes. No consolidative process. No pneumothorax or effusion. The aorta is calcified. The remaining cardiomediastinal contours are unremarkable. No acute osseous or soft tissue abnormality. IMPRESSION: Mild basilar airways thickening, could reflect bronchitis or reactive airways disease. Some additional streaky opacities favoring atelectasis given low volumes. Electronically Signed   By: Lovena Le M.D.   On: 03/30/2020 23:24    EKG: Independently reviewed.  Sinus tachycardia, borderline ST changes in inferior and lateral leads.  No prior tracing for comparison.  Assessment/Plan Principal Problem:   UTI (urinary tract infection) Active Problems:   Mild intermittent asthma, uncomplicated   AKI (acute kidney injury) ([place])   Anemia   General weakness   Sepsis secondary to UTI: Febrile, tachycardic, and mildly hypotensive on arrival.  Labs showing leukocytosis but no lactic acidosis.  UA with evidence of pyuria and bacteriuria. -Received 30cc/kg fluid boluses in the ED and blood pressure has now improved and tachycardia resolved.  Continue ceftriaxone.  Follow urine and blood cultures.  Monitor WBC count.  Nausea, emesis: LFTs normal.  No complaints of abdominal pain or diarrhea.  Abdominal exam benign.  Symptoms have now resolved and she wants to eat. -IV fluid hydration, antiemetic if needed  AKI: Likely prerenal from dehydration.  BUN 20, creatinine 1.6.  No prior labs  for comparison.  Creatinine improved to 1.4 with IV fluid. -Continue IV fluid hydration and monitor renal function.  Avoid nephrotoxic agents.  Hold home lisinopril.  Normocytic anemia: Hemoglobin 11.0, MCV 93.4.  No prior labs for comparison.  Patient is not endorsing any GI bleed symptoms. -Anemia panel  Generalized weakness: Likely due to UTI. -PT/OT eval, fall precautions  Mild intermittent asthma, allergic rhinitis -Continue albuterol as needed, antihistamine  Hypertension -Hold antihypertensives given concern for sepsis.  Hyperlipidemia -Continue Lipitor  DVT prophylaxis: Lovenox Code Status: Full code-discussed with the patient. Family Communication: No family available at this time.  Diagnostic findings and treatment plan discussed with the patient. Disposition Plan: Status is: Observation  The patient remains OBS appropriate and will d/c before 2 midnights.  Dispo: The patient is from: Home              Anticipated d/c is to: Home              Anticipated d/c date is: 2 days              Patient currently is not medically stable to d/c.   Difficult to place patient No  Level of care: Level of care: Telemetry   The medical decision making on this patient was of high complexity and the patient is at high risk for clinical deterioration, therefore this is a level 3 visit.  Shela Leff MD [facility]  If 7PM-7AM, please contact night-coverage [email or web address]  03/31/2020, 4:47 AM

## 2020-03-31 NOTE — ED Notes (Signed)
Report given to  Sarah, RN.

## 2020-03-31 NOTE — Plan of Care (Signed)

## 2020-03-31 NOTE — ED Notes (Signed)
Pt and family updated on plan of care  

## 2020-03-31 NOTE — Progress Notes (Signed)
PROGRESS NOTE    DAVEAH VARONE  IRS:[id number] DOB: Aug 07, 1945 DOA: 03/30/2020 PCP: Kathyrn Lass, MD    Brief Narrative:  [name] [name] is a 75 y.o. female with medical history significant of asthma, hypertension, hyperlipidemia presented to the ED with complaint generalized weakness.  Reports history of symptoms of an acute viral illness such as fever, headaches, weakness about 3 weeks ago and states she was tested for Covid at that time and it was negative.  She is fully vaccinated against COVID.  Patient states all those symptoms have now resolved.  However, she continues to have generalized weakness and malaise for the past 2 weeks.  She did vomit 3 consecutive times today but it was not food, just a small amount of water.  She is no longer nauseous and wants to eat.  Denies abdominal pain.  Reports having chronic urinary frequency/urgency due to history of interstitial cystitis.  She has not had any dysuria.  No other complaints.  Denies fevers, cough, or shortness of breath.  In the ED, temperature 100.3 F, HR 1 1, RR 18, BP 97/68, SPO2 100% on room air. WBC 16.6, hemoglobin 11.0, MCV 93.4, platelet count 379K.  Sodium 135, potassium 5.4, chloride 102, bicarb 24, BUN 20, creatinine 1.6, glucose 135.  LFTs normal.  Repeat metabolic panel showing potassium 4.2 and creatinine 1.4.  UA with moderate amount of leukocytes, greater than 50 WBCs, and many bacteria.  High-sensitivity troponin negative.  Lactic acid normal.  INR 1.1.  Blood culture x2 pending.  Urine culture pending. SARS-CoV-2 PCR test negative.  Influenza panel negative.  Chest x-ray showing mild bibasilar airway thickening, could reflect bronchitis or reactive airway disease.  Showing some additional streaky opacities felt to be atelectasis given low lung volumes. Patient was given ceftriaxone and 30 cc/kg fluid boluses per sepsis protocol.  Hospital service consulted for further evaluation and management of sepsis secondary to  UTI.   Assessment & Plan:   Principal Problem:   UTI (urinary tract infection) Active Problems:   Mild intermittent asthma, uncomplicated   AKI (acute kidney injury) ([place])   Anemia   General weakness   Sepsis, POA Urinary tract infection Patient presented to ED with generalized weakness.  Patient was found to be febrile with temperature 100.3 F, tachycardic and hypotensive on arrival.  Urinalysis with moderate leukoesterase, negative nitrite, many bacteria and greater than 50 WBCs.  Elevated white blood cell count 16.6, and was found to be in acute renal failure with creatinine 1.68.  Patient received IV fluid resuscitation with NS at 30 cc/kg. --WBC 16.6>10.7 --Blood cultures x2: Pending --Urine culture: Pending --Ceftriaxone 1 g IV every 24 hours  Acute renal failure Etiology likely secondary to prerenal from dehydration versus ATN from mild hypotension.  Received 30 cc/kg IV fluid resuscitation for sepsis as above. --Cr 1.68>1.43>1.53 --Holding home lisinopril --Continue NS at 125 mL's per hour --Avoid nephrotoxins, renally dose all medications --BMP daily  Normocytic anemia Hemoglobin 11.0, MCV 93.4 on arrival.  Denies any blood in stool.  Iron level 22, TIBC low at 157, ferritin 219. --CBC daily  Generalized weakness Likely secondary to UTI/sepsis as above. --PT/OT evaluation  Mild intermittent asthma Allergic rhinitis --Albuterol as needed, Claritin 10 mg p.o. daily  Essential hypertension --Holding lisinopril secondary to hypotension on arrival and acute renal failure --Continue to monitor BP closely  Hyperlipidemia: Continue atorvastatin 10 mg p.o. daily   DVT prophylaxis: Lovenox   Code Status: Full Code Family Communication: Updated patient extensively at  bedside  Disposition Plan:  Level of care: Telemetry Status is: Observation  The patient remains OBS appropriate and will d/c before 2 midnights.  Dispo: The patient is from: Home               Anticipated d/c is to: Home              Anticipated d/c date is: 1 day              Patient currently is not medically stable to d/c.   Difficult to place patient No  Consultants:   None  Procedures:   None  Antimicrobials:   Ceftriaxone 2/21>>   Subjective: Patient seen and examined at bedside, resting comfortably.  Continues in ED holding area.  States weakness and fatigue much improved with IV fluid hydration.  No other questions or concerns at this time.  Denies headache, no current fever/chills/night sweats, no current nausea/vomiting/diarrhea, no chest pain, palpitations, no shortness of breath, no abdominal pain, no paresthesias.  No acute events overnight per nursing staff.  Objective: Vitals:   03/31/20 0715 03/31/20 0830 03/31/20 0845 03/31/20 0900  BP: (!) 107/51 (!) 112/54 (!) 125/59 128/69  Pulse: 71 73 71 80  Resp: 20 (!) 23 (!) 27 (!) 29  Temp:      TempSrc:      SpO2: 94% 97% 96% 97%  Weight:      Height:       No intake or output data in the 24 hours ending 03/31/20 1055 Filed Weights   03/30/20 1612  Weight: 73 kg    Examination:  General exam: Appears calm and comfortable  Respiratory system: Clear to auscultation. Respiratory effort normal.  Oxygenating well on room air Cardiovascular system: S1 & S2 heard, RRR. No JVD, murmurs, rubs, gallops or clicks. No pedal edema. Gastrointestinal system: Abdomen is nondistended, soft and nontender. No organomegaly or masses felt. Normal bowel sounds heard. Central nervous system: Alert and oriented. No focal neurological deficits. Extremities: Symmetric 5 x 5 power. Skin: No rashes, lesions or ulcers Psychiatry: Judgement and insight appear normal. Mood & affect appropriate.     Data Reviewed: I have personally reviewed following labs and imaging studies  CBC: Recent Labs  Lab 03/30/20 1713 03/31/20 0416  WBC 16.6* 10.7*  HGB 11.0* 9.1*  HCT 34.1* 28.7*  MCV 93.4 95.7  PLT 379 397   Basic  Metabolic Panel: Recent Labs  Lab 03/30/20 1713 03/30/20 2340 03/31/20 0416  NA 135 136 135  K 5.4* 4.2 4.3  CL 102 103 102  CO2 24 23 22   GLUCOSE 135* 117* 110*  BUN 20 20 21   CREATININE 1.68* 1.43* 1.53*  CALCIUM 9.0 8.7* 8.8*   GFR: Estimated Creatinine Clearance: 34.4 mL/min (A) (by C-G formula based on SCr of 1.53 mg/dL (H)). Liver Function Tests: Recent Labs  Lab 03/30/20 2340  AST 16  ALT 20  ALKPHOS 108  BILITOT 0.6  PROT 6.8  ALBUMIN 3.0*   No results for input(s): LIPASE, AMYLASE in the last 168 hours. No results for input(s): AMMONIA in the last 168 hours. Coagulation Profile: Recent Labs  Lab 03/30/20 2340  INR 1.1   Cardiac Enzymes: No results for input(s): CKTOTAL, CKMB, CKMBINDEX, TROPONINI in the last 168 hours. BNP (last 3 results) No results for input(s): PROBNP in the last 8760 hours. HbA1C: No results for input(s): HGBA1C in the last 72 hours. CBG: Recent Labs  Lab 03/30/20 1634  GLUCAP 121*   Lipid  Profile: No results for input(s): CHOL, HDL, LDLCALC, TRIG, CHOLHDL, LDLDIRECT in the last 72 hours. Thyroid Function Tests: No results for input(s): TSH, T4TOTAL, FREET4, T3FREE, THYROIDAB in the last 72 hours. Anemia Panel: Recent Labs    03/31/20 0416  VITAMINB12 >7,500*  FOLATE 16.4  FERRITIN 219  TIBC 157*  IRON 22*  RETICCTPCT 0.9   Sepsis Labs: Recent Labs  Lab 03/30/20 2340  LATICACIDVEN 1.0    Recent Results (from the past 240 hour(s))  Resp Panel by RT-PCR (Flu A&B, Covid) Nasopharyngeal Swab     Status: None   Collection Time: 03/31/20 12:27 AM   Specimen: Nasopharyngeal Swab; Nasopharyngeal(NP) swabs in vial transport medium  Result Value Ref Range Status   SARS Coronavirus 2 by RT PCR NEGATIVE NEGATIVE Final    Comment: (NOTE) SARS-CoV-2 target nucleic acids are NOT DETECTED.  The SARS-CoV-2 RNA is generally detectable in upper respiratory specimens during the acute phase of infection. The  lowest concentration of SARS-CoV-2 viral copies this assay can detect is 138 copies/mL. A negative result does not preclude SARS-Cov-2 infection and should not be used as the sole basis for treatment or other patient management decisions. A negative result may occur with  improper specimen collection/handling, submission of specimen other than nasopharyngeal swab, presence of viral mutation(s) within the areas targeted by this assay, and inadequate number of viral copies(<138 copies/mL). A negative result must be combined with clinical observations, patient history, and epidemiological information. The expected result is Negative.  Fact Sheet for Patients:  EntrepreneurPulse.com.au  Fact Sheet for Healthcare Providers:  IncredibleEmployment.be  This test is no t yet approved or cleared by the [place] FDA and  has been authorized for detection and/or diagnosis of SARS-CoV-2 by FDA under an Emergency Use Authorization (EUA). This EUA will remain  in effect (meaning this test can be used) for the duration of the COVID-19 declaration under Section 564(b)(1) of the Act, 21 U.S.C.section 360bbb-3(b)(1), unless the authorization is terminated  or revoked sooner.       Influenza A by PCR NEGATIVE NEGATIVE Final   Influenza B by PCR NEGATIVE NEGATIVE Final    Comment: (NOTE) The Xpert Xpress SARS-CoV-2/FLU/RSV plus assay is intended as an aid in the diagnosis of influenza from Nasopharyngeal swab specimens and should not be used as a sole basis for treatment. Nasal washings and aspirates are unacceptable for Xpert Xpress SARS-CoV-2/FLU/RSV testing.  Fact Sheet for Patients: EntrepreneurPulse.com.au  Fact Sheet for Healthcare Providers: IncredibleEmployment.be  This test is not yet approved or cleared by the [place] FDA and has been authorized for detection and/or diagnosis of SARS-CoV-2 by FDA under  an Emergency Use Authorization (EUA). This EUA will remain in effect (meaning this test can be used) for the duration of the COVID-19 declaration under Section 564(b)(1) of the Act, 21 U.S.C. section 360bbb-3(b)(1), unless the authorization is terminated or revoked.  Performed at [facility], [place] [address], [place], [place] [address]          Radiology Studies: [facility] Chest Port 1 View  Result Date: 03/30/2020 CLINICAL DATA:  Questionable sepsis, weakness for 1 week, low-grade fever EXAM: PORTABLE CHEST 1 VIEW COMPARISON:  None. FINDINGS: Mild airways thickening. Some additional streaky opacity in the bases possibly related atelectasis given some low lung volumes. No consolidative process. No pneumothorax or effusion. The aorta is calcified. The remaining cardiomediastinal contours are unremarkable. No acute osseous or soft tissue abnormality. IMPRESSION: Mild basilar airways thickening, could reflect bronchitis or reactive  airways disease. Some additional streaky opacities favoring atelectasis given low volumes. Electronically Signed   By: Lovena Le M.D.   On: 03/30/2020 23:24        Scheduled Meds: . amitriptyline  10 mg Oral QHS  . atorvastatin  10 mg Oral Daily  . enoxaparin (LOVENOX) injection  40 mg Subcutaneous Q24H  . loratadine  10 mg Oral Daily  . multivitamin with minerals  1 tablet Oral Daily  . olopatadine  1 drop Both Eyes BID  . vitamin B-12  5,000 mcg Oral Daily   Continuous Infusions: . sodium chloride 125 mL/hr at 03/31/20 0641  . cefTRIAXone (ROCEPHIN)  IV Stopped (03/31/20 0340)     LOS: 0 days    Time spent: 39 minutes spent on chart review, discussion with nursing staff, consultants, updating family and interview/physical exam; more than 50% of that time was spent in counseling and/or coordination of care.    Eric J [place] DO [facility] Available via Epic secure chat 7am-7pm After these hours, please refer to  coverage provider listed on [email or web address] 03/31/2020, 10:55 AM

## 2020-03-31 NOTE — ED Notes (Signed)
Patient received lunch tray 

## 2020-04-01 DIAGNOSIS — N390 Urinary tract infection, site not specified: Secondary | ICD-10-CM | POA: Diagnosis not present

## 2020-04-01 DIAGNOSIS — N179 Acute kidney failure, unspecified: Secondary | ICD-10-CM | POA: Diagnosis not present

## 2020-04-01 DIAGNOSIS — J4520 Mild intermittent asthma, uncomplicated: Secondary | ICD-10-CM

## 2020-04-01 LAB — URINE CULTURE: Culture: <10000 — AB

## 2020-04-01 LAB — CBC
HCT: 28.5 % — ABNORMAL LOW (ref 36.0–46.0)
Hemoglobin: 9 g/dL — ABNORMAL LOW (ref 12.0–15.0)
MCH: 30.2 pg (ref 26.0–34.0)
MCHC: 31.6 g/dL (ref 30.0–36.0)
MCV: 95.6 fL (ref 80.0–100.0)
Platelets: 296 10*3/uL (ref 150–400)
RBC: 2.98 MIL/uL — ABNORMAL LOW (ref 3.87–5.11)
RDW: 14 % (ref 11.5–15.5)
WBC: 6.8 10*3/uL (ref 4.0–10.5)
nRBC: 0 % (ref 0.0–0.2)

## 2020-04-01 LAB — BASIC METABOLIC PANEL
Anion gap: 8 (ref 5–15)
BUN: 14 mg/dL (ref 8–23)
CO2: 22 mmol/L (ref 22–32)
Calcium: 7.8 mg/dL — ABNORMAL LOW (ref 8.9–10.3)
Chloride: 109 mmol/L (ref 98–111)
Creatinine, Ser: 1.07 mg/dL — ABNORMAL HIGH (ref 0.44–1.00)
GFR, Estimated: 55 mL/min — ABNORMAL LOW (ref >60)
Glucose, Bld: 103 mg/dL — ABNORMAL HIGH (ref 70–99)
Potassium: 4.7 mmol/L (ref 3.5–5.1)
Sodium: 139 mmol/L (ref 135–145)

## 2020-04-01 MED ORDER — CEPHALEXIN 500 MG PO CAPS: 500.0000 mg | ORAL_CAPSULE | Freq: Two times a day (BID) | ORAL | 0 refills | Status: AC

## 2020-04-01 NOTE — Discharge Instructions (Signed)
Urinary Tract Infection, Adult A urinary tract infection (UTI) is an infection of any part of the urinary tract. The urinary tract includes:  The kidneys.  The ureters.  The bladder.  The urethra. These organs make, store, and get rid of pee (urine) in the body. What are the causes? This infection is caused by germs (bacteria) in your genital area. These germs grow and cause swelling (inflammation) of your urinary tract. What increases the risk? The following factors may make you more likely to develop this condition:  Using a small, thin tube (catheter) to drain pee.  Not being able to control when you pee or poop (incontinence).  Being female. If you are female, these things can increase the risk: ? Using these methods to prevent pregnancy:  A medicine that kills sperm (spermicide).  A device that blocks sperm (diaphragm). ? Having low levels of a female hormone (estrogen). ? Being pregnant. You are more likely to develop this condition if:  You have genes that add to your risk.  You are sexually active.  You take antibiotic medicines.  You have trouble peeing because of: ? A prostate that is bigger than normal, if you are female. ? A blockage in the part of your body that drains pee from the bladder. ? A kidney stone. ? A nerve condition that affects your bladder. ? Not getting enough to drink. ? Not peeing often enough.  You have other conditions, such as: ? Diabetes. ? A weak disease-fighting system (immune system). ? Sickle cell disease. ? Gout. ? Injury of the spine. What are the signs or symptoms? Symptoms of this condition include:  Needing to pee right away.  Peeing small amounts often.  Pain or burning when peeing.  Blood in the pee.  Pee that smells bad or not like normal.  Trouble peeing.  Pee that is cloudy.  Fluid coming from the vagina, if you are female.  Pain in the belly or lower back. Other symptoms include:  Vomiting.  Not  feeling hungry.  Feeling mixed up (confused). This may be the first symptom in older adults.  Being tired and grouchy (irritable).  A fever.  Watery poop (diarrhea). How is this treated?  Taking antibiotic medicine.  Taking other medicines.  Drinking enough water. In some cases, you may need to see a specialist. Follow these instructions at home: Medicines  Take over-the-counter and prescription medicines only as told by your doctor.  If you were prescribed an antibiotic medicine, take it as told by your doctor. Do not stop taking it even if you start to feel better. General instructions  Make sure you: ? Pee until your bladder is empty. ? Do not hold pee for a long time. ? Empty your bladder after sex. ? Wipe from front to back after peeing or pooping if you are a female. Use each tissue one time when you wipe.  Drink enough fluid to keep your pee pale yellow.  Keep all follow-up visits.   Contact a doctor if:  You do not get better after 1-2 days.  Your symptoms go away and then come back. Get help right away if:  You have very bad back pain.  You have very bad pain in your lower belly.  You have a fever.  You have chills.  You feeling like you will vomit or you vomit. Summary  A urinary tract infection (UTI) is an infection of any part of the urinary tract.  This condition is caused by   germs in your genital area.  There are many risk factors for a UTI.  Treatment includes antibiotic medicines.  Drink enough fluid to keep your pee pale yellow. This information is not intended to replace advice given to you by your health care provider. Make sure you discuss any questions you have with your health care provider. Document Revised: 09/06/2019 Document Reviewed: 09/06/2019 Elsevier Patient Education  2021 Elsevier Inc.  

## 2020-04-01 NOTE — Evaluation (Signed)
Physical Therapy Evaluation Patient Details Name: [name] [name] MRN: [id number] DOB: [birth year] Today's Date: 04/01/2020   History of Present Illness  Pt is 75 yo female with PMH including asthma, HTN, and hyperlipidemia.  Pt admitted with UTI.  She had a viral illness about 3 weeks ago and has remained weak since that time.  Clinical Impression  Pt admitted with UTI.  She reports feeling weak for about 3 weeks but is feeling much better now.  Pt demonstrated safe transfers, balance, gait, and stairs safely and without difficulty.  Pt reports only slight decrease in endurance, but otherwise is feeling much better.  Pt with good support at home.  Pt with no further PT needs and is demonstrating independent mobility.     Follow Up Recommendations No PT follow up    Equipment Recommendations  None recommended by PT    Recommendations for Other Services       Precautions / Restrictions Precautions Precautions: None      Mobility  Bed Mobility Overal bed mobility: Independent                  Transfers Overall transfer level: Independent                  Ambulation/Gait Ambulation/Gait assistance: Supervision Gait Distance (Feet): 500 Feet Assistive device: None Gait Pattern/deviations: WFL(Within Functional Limits) Gait velocity: normal   General Gait Details: Had supervision but was safe and with normal independent gait  Stairs Stairs: Yes Stairs assistance: Supervision Stair Management: One rail Right;Alternating pattern;Forwards Number of Stairs: 16 General stair comments: without difficulty  Wheelchair Mobility    Modified Rankin (Stroke Patients Only)       Balance Overall balance assessment: Independent                                           Pertinent Vitals/Pain Pain Assessment: No/denies pain    Home Living Family/patient expects to be discharged to:: Private residence Living Arrangements: Spouse/significant  other Available Help at Discharge: Family;Available 24 hours/day Type of Home: House Home Access: Stairs to enter Entrance Stairs-Rails: Psychiatric nurse of Steps: 2 Home Layout: Two level;Able to live on main level with bedroom/bathroom Home Equipment: Gilford Rile - 2 wheels;Cane - single point      Prior Function Level of Independence: Independent         Comments: Very independent with IADLs, ADLs, driving, shopping, etc     Hand Dominance        Extremity/Trunk Assessment   Upper Extremity Assessment Upper Extremity Assessment: Overall WFL for tasks assessed    Lower Extremity Assessment Lower Extremity Assessment: Overall WFL for tasks assessed    Cervical / Trunk Assessment Cervical / Trunk Assessment: Normal  Communication   Communication: No difficulties  Cognition Arousal/Alertness: Awake/alert Behavior During Therapy: WFL for tasks assessed/performed Overall Cognitive Status: Within Functional Limits for tasks assessed                                        General Comments General comments (skin integrity, edema, etc.): Pt reports feeling much better since starting antibiotics for UTI and has been independent in room    Exercises     Assessment/Plan    PT Assessment Patent does not need any further PT  services  PT Problem List         PT Treatment Interventions      PT Goals (Current goals can be found in the Care Plan section)  Acute Rehab PT Goals Patient Stated Goal: return home and clean house PT Goal Formulation: With patient Time For Goal Achievement: 04/15/20 Potential to Achieve Goals: Good    Frequency     Barriers to discharge        Co-evaluation               AM-PAC PT "6 Clicks" Mobility  Outcome Measure Help needed turning from your back to your side while in a flat bed without using bedrails?: None Help needed moving from lying on your back to sitting on the side of a flat bed  without using bedrails?: None Help needed moving to and from a bed to a chair (including a wheelchair)?: None Help needed standing up from a chair using your arms (e.g., wheelchair or bedside chair)?: None Help needed to walk in hospital room?: None Help needed climbing 3-5 steps with a railing? : None 6 Click Score: 24    End of Session   Activity Tolerance: Patient tolerated treatment well Patient left: in bed;with call bell/phone within reach        Time: 1025-1037 PT Time Calculation (min) (ACUTE ONLY): 12 min   Charges:   PT Evaluation $PT Eval Low Complexity: 1 Low          [name] [name], PT Acute Rehab Services Pager [phone] [facility] [phone]    Karlton Lemon 04/01/2020, 10:43 AM

## 2020-04-01 NOTE — Progress Notes (Signed)
OT Cancellation Note  Patient Details Name: MARGARETTE VANNATTER MRN: [id number] DOB: 1945-04-29   Cancelled Treatment:    Reason Eval/Treat Not Completed: OT screened, no needs identified, will sign off. Spoke with PT who states patient is fully I. Spoke with patient who reports no concerns re: D/C home and being able to care for herself. Please re-order if new needs arise.  Delbert Phenix OT OT pager: [place] 04/01/2020, 11:43 AM

## 2020-04-01 NOTE — Discharge Summary (Signed)
Physician Discharge Summary  [name] [name] FTD:[id number] DOB: [birth year] DOA: 03/30/2020  PCP: Kathyrn Lass, MD  Admit date: 03/30/2020 Discharge date: 04/01/2020  Admitted From: Home.  Disposition:  HOme.   Recommendations for Outpatient Follow-up:  1. Follow up with PCP in 1-2 weeks 2. Please obtain BMP/CBC in one week     Discharge Condition: stable.  CODE STATUS: (FULL CODE) Diet recommendation: Heart Healthy   Brief/Interim Summary: [name] [name] is a 75 y.o.femalewith medical history significant ofasthma, hypertension, hyperlipidemia presented to the ED with complaintgeneralized weakness. Reports history of symptoms of an acute viral illness such as fever, headaches, weakness about 3 weeks ago and states she was tested for Covid at that time and it was negative. She is fully vaccinated against COVID. Patient states all those symptoms have now resolved. However, she continues to have generalized weakness and malaise for the past 2 weeks. She did vomit 3 consecutive times today but it was not food, just a small amount of water. She is no longer nauseous and wants to eat. Denies abdominal pain. Reports having chronic urinary frequency/urgency due to history of interstitial cystitis. UA with moderate amount of leukocytes, greater than 50 WBCs, and many bacteria. High-sensitivity troponin negative. Lactic acid normal. INR 1.1. Blood culture x2 negative.. Urine culture NEGATIVE. SARS-CoV-2 PCR test negative. Influenza panel negative.Chest x-ray showing mild bibasilar airway thickening, could reflect bronchitis or reactive airway disease. Showing some additional streaky opacities felt to be atelectasis given low lung volumes. Patient was given ceftriaxone and 30 cc/kgfluid boluses per sepsis protocol.  Hospital service consulted for further evaluation and management of sepsis secondary to UTI.   Discharge Diagnoses:  Principal Problem:   UTI (urinary tract  infection) Active Problems:   Mild intermittent asthma, uncomplicated   AKI (acute kidney injury) ([place])   Anemia   General weakness   Sepsis ([place])   Sepsis, POA Urinary tract infection Patient presented to ED with generalized weakness.  Patient was found to be febrile with temperature 100.3 F, tachycardic and hypotensive on arrival.  Urinalysis with moderate leukoesterase, negative nitrite, many bacteria and greater than 50 WBCs.  Elevated white blood cell count 16.6, and was found to be in acute renal failure with creatinine 1.68.  Patient received IV fluid resuscitation with NS at 30 cc/kg. --WBC 16.6>10.7. wbc count today normalized.  --Blood cultures x2: negative so far.  --Urine culture: less than 10,000 bacteria/ insignificant growth.  - recommend another 7 day sof keflex to complete the course.  - pt reports all her symptoms have resolved and she would like to go home.  - therapy evals recommended no follow up .   Acute renal failure Etiology likely secondary to prerenal from dehydration versus ATN from mild hypotension.  Received 30 cc/kg IV fluid resuscitation for sepsis as above. --Cr 1.68>1.43>1.53>1.  --Holding home lisinopril. Recommend checking BMP in one week.    Normocytic anemia Hemoglobin 11.0, MCV 93.4 on arrival.  Denies any blood in stool.  Iron level 22, TIBC low at 157, ferritin 219. Hemoglobin around 9, probably from hemodilution. Iron supplementation recommended.  Recheck cbc in one week.   Generalized weakness Likely secondary to UTI/sepsis as above. --PT/OT evaluation  Mild intermittent asthma Allergic rhinitis --Albuterol as needed, Claritin 10 mg p.o. daily  Essential hypertension --Holding lisinopril secondary to hypotension on arrival and acute renal failure --Continue to monitor BP closely Recommend outpatient follow upwith PCP and recheck BP and slowly restart lisinopril.  Hyperlipidemia: Continue atorvastatin 10 mg p.o.  daily  Discharge Instructions  Discharge Instructions    Diet - low sodium heart healthy   Complete by: As directed    Diet - low sodium heart healthy   Complete by: As directed    Discharge instructions   Complete by: As directed    Please follow up with PCP in one week.  You will need CBC to check hemoglobin and BMP to check your kidney function.   Increase activity slowly   Complete by: As directed      Allergies as of 04/01/2020   No Known Allergies     Medication List    STOP taking these medications   lisinopril 5 MG tablet Commonly known as: ZESTRIL     TAKE these medications   albuterol 108 (90 Base) MCG/ACT inhaler Commonly known as: ProAir HFA Inhale 2 puffs into the lungs every 4 (four) hours as needed for wheezing or shortness of breath.   alendronate 70 MG tablet Commonly known as: FOSAMAX Take 70 mg by mouth once a week. Sunday   amitriptyline 10 MG tablet Commonly known as: ELAVIL Take 10 mg by mouth at bedtime.   atorvastatin 10 MG tablet Commonly known as: LIPITOR Take 10 mg by mouth daily.   B-12 5000 MCG Tbdp Take 1 tablet by mouth daily.   Black Elderberry(Berry-Flower) 575 MG Caps Take 2 capsules by mouth daily.   Calcium 500-125 MG-UNIT Tabs Take 2 tablets by mouth daily.   cephALEXin 500 MG capsule Commonly known as: KEFLEX Take 1 capsule (500 mg total) by mouth 2 (two) times daily for 7 days.   cetirizine 10 MG tablet Commonly known as: ZYRTEC Take 10 mg by mouth daily.   EPINEPHrine 0.3 mg/0.3 mL Soaj injection Commonly known as: EPI-PEN Inject into thigh for severe allergic reaction   multivitamin capsule Take 1 capsule by mouth daily.   NASACORT AQ NA Place 2 puffs into the nose daily.   NONFORMULARY OR COMPOUNDED ITEM Allergy Vaccine 1:10 Given at Home   olopatadine 0.1 % ophthalmic solution Commonly known as: PATANOL Place 1 drop into both eyes in the morning and at bedtime.   OVER THE COUNTER  MEDICATION CYSTORENEW   solifenacin 5 MG tablet Commonly known as: VESICARE Take 5 mg by mouth daily.       Follow-up Information    Kathyrn Lass, MD. Schedule an appointment as soon as possible for a visit in 1 week(s).   Specialty: Family Medicine Contact information: [place] [address] [phone]              No Known Allergies  Consultations:  *none.      Procedures/Studies: DG Chest Port 1 View  Result Date: 03/30/2020 CLINICAL DATA:  Questionable sepsis, weakness for 1 week, low-grade fever EXAM: PORTABLE CHEST 1 VIEW COMPARISON:  None. FINDINGS: Mild airways thickening. Some additional streaky opacity in the bases possibly related atelectasis given some low lung volumes. No consolidative process. No pneumothorax or effusion. The aorta is calcified. The remaining cardiomediastinal contours are unremarkable. No acute osseous or soft tissue abnormality. IMPRESSION: Mild basilar airways thickening, could reflect bronchitis or reactive airways disease. Some additional streaky opacities favoring atelectasis given low volumes. Electronically Signed   By: Lovena Le M.D.   On: 03/30/2020 23:24       Subjective: No new complaints.   Discharge Exam: Vitals:   04/01/20 0003 04/01/20 0409  BP: (!) 120/49 (!) 114/55  Pulse: 75 69  Resp:  Temp: 98.7 F (37.1 C) 98.3 F (36.8 C)  SpO2: 96% 97%   Vitals:   03/31/20 1612 03/31/20 2003 04/01/20 0003 04/01/20 0409  BP: 123/63 (!) 121/49 (!) 120/49 (!) 114/55  Pulse: 68 75 75 69  Resp: 20 16    Temp: 98.8 F (37.1 C) 98.4 F (36.9 C) 98.7 F (37.1 C) 98.3 F (36.8 C)  TempSrc: Oral Oral Oral Oral  SpO2: 99% 97% 96% 97%  Weight:      Height:        General: Pt is alert, awake, not in acute distress Cardiovascular: RRR, S1/S2 +, no rubs, no gallops Respiratory: CTA bilaterally, no wheezing, no rhonchi Abdominal: Soft, NT, ND, bowel sounds + Extremities: no edema, no  cyanosis    The results of significant diagnostics from this hospitalization (including imaging, microbiology, ancillary and laboratory) are listed below for reference.     Microbiology: Recent Results (from the past 240 hour(s))  Blood Culture (routine x 2)     Status: None (Preliminary result)   Collection Time: 03/30/20 11:40 PM   Specimen: BLOOD RIGHT HAND  Result Value Ref Range Status   Specimen Description   Final    BLOOD RIGHT HAND Performed at [facility], [place] [address] [place], [place] [address]    Special Requests   Final    BOTTLES DRAWN AEROBIC ONLY Blood Culture adequate volume Performed at [facility] [address], [place], [place] [address]    Culture   Final    NO GROWTH 1 DAY Performed at [facility], [place] [address], [place], [place] [address]    Report Status PENDING  Incomplete  Urine culture     Status: Abnormal   Collection Time: 03/30/20 11:50 PM   Specimen: In/Out Cath Urine  Result Value Ref Range Status   Specimen Description   Final    IN/OUT CATH URINE Performed at [facility] [address], [place], [place] [address]    Special Requests   Final    NONE Performed at [facility], [place] [address], [place], [place] [address]    Culture (A)  Final    <10,000 COLONIES/mL INSIGNIFICANT GROWTH Performed at [facility] [address] [place], [place] [address]    Report Status 04/01/2020 FINAL  Final  Resp Panel by RT-PCR (Flu A&B, Covid) Nasopharyngeal Swab     Status: None   Collection Time: 03/31/20 12:27 AM   Specimen: Nasopharyngeal Swab; Nasopharyngeal(NP) swabs in vial transport medium  Result Value Ref Range Status   SARS Coronavirus 2 by RT PCR NEGATIVE NEGATIVE Final    Comment: (NOTE) SARS-CoV-2 target nucleic acids are NOT DETECTED.  The SARS-CoV-2 RNA is generally detectable in upper respiratory specimens during the acute phase of  infection. The lowest concentration of SARS-CoV-2 viral copies this assay can detect is 138 copies/mL. A negative result does not preclude SARS-Cov-2 infection and should not be used as the sole basis for treatment or other patient management decisions. A negative result may occur with  improper specimen collection/handling, submission of specimen other than nasopharyngeal swab, presence of viral mutation(s) within the areas targeted by this assay, and inadequate number of viral copies(<138 copies/mL). A negative result must be combined with clinical observations, patient history, and epidemiological information. The expected result is Negative.  Fact Sheet for Patients:  EntrepreneurPulse.com.au  Fact Sheet for Healthcare Providers:  IncredibleEmployment.be  This test is no t yet approved  or cleared by the [place] and  has been authorized for detection and/or diagnosis of SARS-CoV-2 by FDA under an Emergency Use Authorization (EUA). This EUA will remain  in effect (meaning this test can be used) for the duration of the COVID-19 declaration under Section 564(b)(1) of the Act, 21 U.S.C.section 360bbb-3(b)(1), unless the authorization is terminated  or revoked sooner.       Influenza A by PCR NEGATIVE NEGATIVE Final   Influenza B by PCR NEGATIVE NEGATIVE Final    Comment: (NOTE) The Xpert Xpress SARS-CoV-2/FLU/RSV plus assay is intended as an aid in the diagnosis of influenza from Nasopharyngeal swab specimens and should not be used as a sole basis for treatment. Nasal washings and aspirates are unacceptable for Xpert Xpress SARS-CoV-2/FLU/RSV testing.  Fact Sheet for Patients: EntrepreneurPulse.com.au  Fact Sheet for Healthcare Providers: IncredibleEmployment.be  This test is not yet approved or cleared by the [place] FDA and has been authorized for detection and/or diagnosis of SARS-CoV-2  by FDA under an Emergency Use Authorization (EUA). This EUA will remain in effect (meaning this test can be used) for the duration of the COVID-19 declaration under Section 564(b)(1) of the Act, 21 U.S.C. section 360bbb-3(b)(1), unless the authorization is terminated or revoked.  Performed at [facility], [place] [address], [place], [place] [address]   Blood Culture (routine x 2)     Status: None (Preliminary result)   Collection Time: 03/31/20 12:27 AM   Specimen: BLOOD  Result Value Ref Range Status   Specimen Description   Final    BLOOD RIGHT WRIST Performed at [facility] [address] [place], [place] [address]    Special Requests   Final    BOTTLES DRAWN AEROBIC ONLY Blood Culture adequate volume Performed at [facility] [address], [place], [place] [address]    Culture   Final    NO GROWTH 1 DAY Performed at [facility], [place] [address], [place], [place] [address]    Report Status PENDING  Incomplete     Labs: BNP (last 3 results) No results for input(s): BNP in the last 8760 hours. Basic Metabolic Panel: Recent Labs  Lab 03/30/20 1713 03/30/20 2340 03/31/20 0416 04/01/20 0353  NA 135 136 135 139  K 5.4* 4.2 4.3 4.7  CL 102 103 102 109  CO2 24 23 22 22   GLUCOSE 135* 117* 110* 103*  BUN 20 20 21 14   CREATININE 1.68* 1.43* 1.53* 1.07*  CALCIUM 9.0 8.7* 8.8* 7.8*   Liver Function Tests: Recent Labs  Lab 03/30/20 2340  AST 16  ALT 20  ALKPHOS 108  BILITOT 0.6  PROT 6.8  ALBUMIN 3.0*   No results for input(s): LIPASE, AMYLASE in the last 168 hours. No results for input(s): AMMONIA in the last 168 hours. CBC: Recent Labs  Lab 03/30/20 1713 03/31/20 0416 04/01/20 0353  WBC 16.6* 10.7* 6.8  HGB 11.0* 9.1* 9.0*  HCT 34.1* 28.7* 28.5*  MCV 93.4 95.7 95.6  PLT 379 300 296   Cardiac Enzymes: No results for input(s): CKTOTAL, CKMB, CKMBINDEX, TROPONINI in the last 168  hours. BNP: Invalid input(s): POCBNP CBG: Recent Labs  Lab 03/30/20 1634  GLUCAP 121*   D-Dimer No results for input(s): DDIMER in the last 72 hours. Hgb A1c No results for input(s): HGBA1C in the last 72 hours. Lipid Profile No results for input(s): CHOL, HDL, LDLCALC, TRIG, CHOLHDL, LDLDIRECT in the last 72 hours. Thyroid function  studies No results for input(s): TSH, T4TOTAL, T3FREE, THYROIDAB in the last 72 hours.  Invalid input(s): FREET3 Anemia work up Recent Labs    03/31/20 0416  VITAMINB12 >7,500*  FOLATE 16.4  FERRITIN 219  TIBC 157*  IRON 22*  RETICCTPCT 0.9   Urinalysis    Component Value Date/Time   COLORURINE AMBER (A) 03/30/2020 1800   APPEARANCEUR CLOUDY (A) 03/30/2020 1800   LABSPEC 1.023 03/30/2020 1800   PHURINE 5.0 03/30/2020 1800   GLUCOSEU NEGATIVE 03/30/2020 1800   HGBUR NEGATIVE 03/30/2020 1800   BILIRUBINUR NEGATIVE 03/30/2020 1800   KETONESUR 5 (A) 03/30/2020 1800   PROTEINUR 30 (A) 03/30/2020 1800   NITRITE NEGATIVE 03/30/2020 1800   LEUKOCYTESUR MODERATE (A) 03/30/2020 1800   Sepsis Labs Invalid input(s): PROCALCITONIN,  WBC,  LACTICIDVEN Microbiology Recent Results (from the past 240 hour(s))  Blood Culture (routine x 2)     Status: None (Preliminary result)   Collection Time: 03/30/20 11:40 PM   Specimen: BLOOD RIGHT HAND  Result Value Ref Range Status   Specimen Description   Final    BLOOD RIGHT HAND Performed at [facility], [place] [address], [place], [place] [address]    Special Requests   Final    BOTTLES DRAWN AEROBIC ONLY Blood Culture adequate volume Performed at [facility] [address] [place], [place] [address]    Culture   Final    NO GROWTH 1 DAY Performed at [facility], [place] [address], [place], [place] [address]    Report Status PENDING  Incomplete  Urine culture     Status: Abnormal   Collection Time: 03/30/20 11:50 PM   Specimen: In/Out Cath Urine   Result Value Ref Range Status   Specimen Description   Final    IN/OUT CATH URINE Performed at [facility] [address] [place], [place] [address]    Special Requests   Final    NONE Performed at [facility], [place] [address] [place], [place] [address]    Culture (A)  Final    <10,000 COLONIES/mL INSIGNIFICANT GROWTH Performed at [facility] [address], [place],  [address]    Report Status 04/01/2020 FINAL  Final  Resp Panel by RT-PCR (Flu A&B, Covid) Nasopharyngeal Swab     Status: None   Collection Time: 03/31/20 12:27 AM   Specimen: Nasopharyngeal Swab; Nasopharyngeal(NP) swabs in vial transport medium  Result Value Ref Range Status   SARS Coronavirus 2 by RT PCR NEGATIVE NEGATIVE Final    Comment: (NOTE) SARS-CoV-2 target nucleic acids are NOT DETECTED.  The SARS-CoV-2 RNA is generally detectable in upper respiratory specimens during the acute phase of infection. The lowest concentration of SARS-CoV-2 viral copies this assay can detect is 138 copies/mL. A negative result does not preclude SARS-Cov-2 infection and should not be used as the sole basis for treatment or other patient management decisions. A negative result may occur with  improper specimen collection/handling, submission of specimen other than nasopharyngeal swab, presence of viral mutation(s) within the areas targeted by this assay, and inadequate number of viral copies(<138 copies/mL). A negative result must be combined with clinical observations, patient history, and epidemiological information. The expected result is Negative.  Fact Sheet for Patients:  EntrepreneurPulse.com.au  Fact Sheet for Healthcare Providers:  IncredibleEmployment.be  This test is no t yet approved or cleared by the [place] FDA and  has been authorized for detection and/or diagnosis of SARS-CoV-2  by FDA under an Emergency Use  Authorization (EUA). This EUA will remain  in effect (meaning this test can be used) for the duration of the COVID-19 declaration under Section 564(b)(1) of the Act, 21 U.S.C.section 360bbb-3(b)(1), unless the authorization is terminated  or revoked sooner.       Influenza A by PCR NEGATIVE NEGATIVE Final   Influenza B by PCR NEGATIVE NEGATIVE Final    Comment: (NOTE) The Xpert Xpress SARS-CoV-2/FLU/RSV plus assay is intended as an aid in the diagnosis of influenza from Nasopharyngeal swab specimens and should not be used as a sole basis for treatment. Nasal washings and aspirates are unacceptable for Xpert Xpress SARS-CoV-2/FLU/RSV testing.  Fact Sheet for Patients: EntrepreneurPulse.com.au  Fact Sheet for Healthcare Providers: IncredibleEmployment.be  This test is not yet approved or cleared by the [place] FDA and has been authorized for detection and/or diagnosis of SARS-CoV-2 by FDA under an Emergency Use Authorization (EUA). This EUA will remain in effect (meaning this test can be used) for the duration of the COVID-19 declaration under Section 564(b)(1) of the Act, 21 U.S.C. section 360bbb-3(b)(1), unless the authorization is terminated or revoked.  Performed at [facility], [place] [address], [place], [place] [address]   Blood Culture (routine x 2)     Status: None (Preliminary result)   Collection Time: 03/31/20 12:27 AM   Specimen: BLOOD  Result Value Ref Range Status   Specimen Description   Final    BLOOD RIGHT WRIST Performed at [facility] [address] [place], [place] [address]    Special Requests   Final    BOTTLES DRAWN AEROBIC ONLY Blood Culture adequate volume Performed at [facility] [address], [place], [place] [address]    Culture   Final    NO GROWTH 1 DAY Performed at [facility], [place] [address], [place], [place] [address]    Report  Status PENDING  Incomplete     Time coordinating discharge: 32 minutes.   SIGNED:   Hosie Poisson, MD  [facility] 04/01/2020, 12:06 PM

## 2020-04-01 NOTE — Progress Notes (Signed)
MD order to discharge.   Discharge instructions given to pt. Reviewed importance of taking antibiotics until complete. Also aware to pick up from her pharmacy and start today.   She verbalizes understanding of discharge instructions and new medication to be started.

## 2020-04-05 LAB — CULTURE, BLOOD (ROUTINE X 2)
Culture: NO GROWTH
Culture: NO GROWTH
Special Requests: ADEQUATE
Special Requests: ADEQUATE

## 2020-04-06 ENCOUNTER — Ambulatory Visit (INDEPENDENT_AMBULATORY_CARE_PROVIDER_SITE_OTHER): Payer: Medicare Other

## 2020-04-06 DIAGNOSIS — J45909 Unspecified asthma, uncomplicated: Secondary | ICD-10-CM | POA: Diagnosis not present

## 2020-04-06 DIAGNOSIS — I10 Essential (primary) hypertension: Secondary | ICD-10-CM | POA: Diagnosis not present

## 2020-04-06 DIAGNOSIS — N1831 Chronic kidney disease, stage 3a: Secondary | ICD-10-CM | POA: Diagnosis not present

## 2020-04-06 DIAGNOSIS — I129 Hypertensive chronic kidney disease with stage 1 through stage 4 chronic kidney disease, or unspecified chronic kidney disease: Secondary | ICD-10-CM | POA: Diagnosis not present

## 2020-04-06 DIAGNOSIS — J309 Allergic rhinitis, unspecified: Secondary | ICD-10-CM | POA: Diagnosis not present

## 2020-04-06 DIAGNOSIS — E782 Mixed hyperlipidemia: Secondary | ICD-10-CM | POA: Diagnosis not present

## 2020-04-08 DIAGNOSIS — A419 Sepsis, unspecified organism: Secondary | ICD-10-CM | POA: Diagnosis not present

## 2020-04-08 DIAGNOSIS — I129 Hypertensive chronic kidney disease with stage 1 through stage 4 chronic kidney disease, or unspecified chronic kidney disease: Secondary | ICD-10-CM | POA: Diagnosis not present

## 2020-04-14 ENCOUNTER — Ambulatory Visit (INDEPENDENT_AMBULATORY_CARE_PROVIDER_SITE_OTHER): Payer: Medicare Other

## 2020-04-14 DIAGNOSIS — J309 Allergic rhinitis, unspecified: Secondary | ICD-10-CM

## 2020-04-19 DIAGNOSIS — G4733 Obstructive sleep apnea (adult) (pediatric): Secondary | ICD-10-CM | POA: Diagnosis not present

## 2020-04-20 DIAGNOSIS — G4733 Obstructive sleep apnea (adult) (pediatric): Secondary | ICD-10-CM | POA: Diagnosis not present

## 2020-04-21 ENCOUNTER — Ambulatory Visit (INDEPENDENT_AMBULATORY_CARE_PROVIDER_SITE_OTHER): Payer: Medicare Other | Admitting: *Deleted

## 2020-04-21 DIAGNOSIS — J309 Allergic rhinitis, unspecified: Secondary | ICD-10-CM | POA: Diagnosis not present

## 2020-04-28 ENCOUNTER — Ambulatory Visit (INDEPENDENT_AMBULATORY_CARE_PROVIDER_SITE_OTHER): Payer: Medicare Other | Admitting: *Deleted

## 2020-04-28 DIAGNOSIS — J309 Allergic rhinitis, unspecified: Secondary | ICD-10-CM | POA: Diagnosis not present

## 2020-05-05 ENCOUNTER — Ambulatory Visit (INDEPENDENT_AMBULATORY_CARE_PROVIDER_SITE_OTHER): Payer: Medicare Other | Admitting: *Deleted

## 2020-05-05 DIAGNOSIS — J309 Allergic rhinitis, unspecified: Secondary | ICD-10-CM | POA: Diagnosis not present

## 2020-05-07 DIAGNOSIS — N3010 Interstitial cystitis (chronic) without hematuria: Secondary | ICD-10-CM | POA: Diagnosis not present

## 2020-05-08 ENCOUNTER — Ambulatory Visit: Payer: Medicare Other

## 2020-05-21 DIAGNOSIS — R350 Frequency of micturition: Secondary | ICD-10-CM | POA: Diagnosis not present

## 2020-05-21 DIAGNOSIS — N3010 Interstitial cystitis (chronic) without hematuria: Secondary | ICD-10-CM | POA: Diagnosis not present

## 2020-05-27 DIAGNOSIS — Z0000 Encounter for general adult medical examination without abnormal findings: Secondary | ICD-10-CM | POA: Diagnosis not present

## 2020-06-01 DIAGNOSIS — N1831 Chronic kidney disease, stage 3a: Secondary | ICD-10-CM | POA: Diagnosis not present

## 2020-06-01 DIAGNOSIS — R6889 Other general symptoms and signs: Secondary | ICD-10-CM | POA: Diagnosis not present

## 2020-06-01 DIAGNOSIS — M859 Disorder of bone density and structure, unspecified: Secondary | ICD-10-CM | POA: Diagnosis not present

## 2020-06-01 DIAGNOSIS — I129 Hypertensive chronic kidney disease with stage 1 through stage 4 chronic kidney disease, or unspecified chronic kidney disease: Secondary | ICD-10-CM | POA: Diagnosis not present

## 2020-06-01 DIAGNOSIS — E782 Mixed hyperlipidemia: Secondary | ICD-10-CM | POA: Diagnosis not present

## 2020-06-01 DIAGNOSIS — E559 Vitamin D deficiency, unspecified: Secondary | ICD-10-CM | POA: Diagnosis not present

## 2020-06-02 ENCOUNTER — Ambulatory Visit (INDEPENDENT_AMBULATORY_CARE_PROVIDER_SITE_OTHER): Payer: Medicare Other | Admitting: *Deleted

## 2020-06-02 DIAGNOSIS — J309 Allergic rhinitis, unspecified: Secondary | ICD-10-CM

## 2020-06-04 ENCOUNTER — Other Ambulatory Visit (HOSPITAL_BASED_OUTPATIENT_CLINIC_OR_DEPARTMENT_OTHER): Payer: Self-pay | Admitting: Family Medicine

## 2020-06-04 ENCOUNTER — Other Ambulatory Visit: Payer: Self-pay | Admitting: Family Medicine

## 2020-06-04 ENCOUNTER — Other Ambulatory Visit: Payer: Self-pay

## 2020-06-04 ENCOUNTER — Ambulatory Visit (HOSPITAL_BASED_OUTPATIENT_CLINIC_OR_DEPARTMENT_OTHER)
Admission: RE | Admit: 2020-06-04 | Discharge: 2020-06-04 | Disposition: A | Discharge status: Home / Self Care | Payer: Medicare Other | Source: Ambulatory Visit | Attending: Family Medicine | Admitting: Family Medicine

## 2020-06-04 DIAGNOSIS — R7989 Other specified abnormal findings of blood chemistry: Secondary | ICD-10-CM | POA: Diagnosis not present

## 2020-06-04 DIAGNOSIS — R911 Solitary pulmonary nodule: Secondary | ICD-10-CM | POA: Diagnosis not present

## 2020-06-04 MED ORDER — IOHEXOL 350 MG/ML SOLN
75.0000 mL | Freq: Once | INTRAVENOUS | Status: AC | PRN
  Administered 2020-06-04: 75 mL via INTRAVENOUS

## 2020-06-05 DIAGNOSIS — J45909 Unspecified asthma, uncomplicated: Secondary | ICD-10-CM | POA: Diagnosis not present

## 2020-06-05 DIAGNOSIS — N1831 Chronic kidney disease, stage 3a: Secondary | ICD-10-CM | POA: Diagnosis not present

## 2020-06-05 DIAGNOSIS — I129 Hypertensive chronic kidney disease with stage 1 through stage 4 chronic kidney disease, or unspecified chronic kidney disease: Secondary | ICD-10-CM | POA: Diagnosis not present

## 2020-06-05 DIAGNOSIS — E782 Mixed hyperlipidemia: Secondary | ICD-10-CM | POA: Diagnosis not present

## 2020-06-09 DIAGNOSIS — N3010 Interstitial cystitis (chronic) without hematuria: Secondary | ICD-10-CM | POA: Diagnosis not present

## 2020-06-24 ENCOUNTER — Other Ambulatory Visit: Payer: Self-pay

## 2020-06-24 ENCOUNTER — Ambulatory Visit
Admission: RE | Admit: 2020-06-24 | Discharge: 2020-06-24 | Disposition: A | Discharge status: Home / Self Care | Payer: Medicare Other | Source: Ambulatory Visit | Attending: Family Medicine | Admitting: Family Medicine

## 2020-06-24 DIAGNOSIS — Z1231 Encounter for screening mammogram for malignant neoplasm of breast: Secondary | ICD-10-CM

## 2020-07-01 ENCOUNTER — Ambulatory Visit (INDEPENDENT_AMBULATORY_CARE_PROVIDER_SITE_OTHER): Payer: Medicare Other

## 2020-07-01 DIAGNOSIS — J309 Allergic rhinitis, unspecified: Secondary | ICD-10-CM

## 2020-07-21 DIAGNOSIS — I10 Essential (primary) hypertension: Secondary | ICD-10-CM | POA: Diagnosis not present

## 2020-07-21 DIAGNOSIS — N1831 Chronic kidney disease, stage 3a: Secondary | ICD-10-CM | POA: Diagnosis not present

## 2020-07-21 DIAGNOSIS — E782 Mixed hyperlipidemia: Secondary | ICD-10-CM | POA: Diagnosis not present

## 2020-07-21 DIAGNOSIS — I129 Hypertensive chronic kidney disease with stage 1 through stage 4 chronic kidney disease, or unspecified chronic kidney disease: Secondary | ICD-10-CM | POA: Diagnosis not present

## 2020-07-21 DIAGNOSIS — J45909 Unspecified asthma, uncomplicated: Secondary | ICD-10-CM | POA: Diagnosis not present

## 2020-07-24 ENCOUNTER — Ambulatory Visit (INDEPENDENT_AMBULATORY_CARE_PROVIDER_SITE_OTHER): Payer: Medicare Other

## 2020-07-24 DIAGNOSIS — J309 Allergic rhinitis, unspecified: Secondary | ICD-10-CM

## 2020-08-02 NOTE — Progress Notes (Signed)
[facility]  Cardiology Office Note:    Date:  08/03/2020  NAME:  [name] [name]    MRN: [id number] DOB:  [birth year]   PCP:  Kathyrn Lass, MD  Cardiologist:  None  Electrophysiologist:  None   Referring MD: Kathyrn Lass, MD   Chief Complaint  Patient presents with   Shortness of Breath         History of Present Illness:    [name] [name] is a 75 y.o. female with a hx of asthma, hypertension, anemia who is being seen today for the evaluation of shortness of breath at the request of Kathyrn Lass, MD. she reports she was hospitalized in February for UTI and sepsis.  She reports that she had kidney injury as well as liver injury.  Symptoms improved.  She was discharged home.  She reports since that time she has had shortness of breath and low energy.  She reports when she does any activity she does gets extremely winded.  She apparently had some episodes of chest discomfort several months ago.  She reports a burning sensation in her chest that would occur randomly.  This was attributed to Fosamax and this resolved with stopping this.  She reports she is just not felt well.  She was sent for cardiac evaluation.  EKG shows sinus rhythm with poor R wave progression.  No history of MI or stroke.  She reports that she is not having exertional chest pain or pressure.  Symptoms of chest pain have resolved.  Her energy is just not where it once was.  She has not had her thyroid rechecked since February.  She is a never smoker.  She does not drink or use drugs.  Both of her parents had heart disease.  Her mom had a valve replacement and father had blockages.  Apparently he lived into his 25s.  She did have a CT PE study in April.  She had no coronary calcium but did have calcifications in the aorta.  She is on a statin.  Most recent LDL was 85.  Likely this is at goal for her.  She was anemic while in the hospital.  Most recent hemoglobin value 12.6.  This appears  to have resolved.  Given that her symptoms of not improved she was sent for cardiology evaluation.  CT PE 06/04/2020 -> 0 CAC  Problem List HTN HLD -Total cholesterol 154, HDL 44, LDL 85, triglycerides 147 Asthma  Anemia   Past Medical History: Past Medical History:  Diagnosis Date   Allergic conjunctivitis    Allergic rhinitis    Asthma    Hyperlipidemia    Hypertension     Past Surgical History: Past Surgical History:  Procedure Laterality Date   FOOT SURGERY     INSERTION / PLACEMENT / REVISION NEUROSTIMULATOR  02/2017   bladder stimulator   TONSILLECTOMY      Current Medications: Current Meds  Medication Sig   albuterol (PROAIR HFA) 108 (90 Base) MCG/ACT inhaler Inhale 2 puffs into the lungs every 4 (four) hours as needed for wheezing or shortness of breath.   amitriptyline (ELAVIL) 10 MG tablet Take 10 mg by mouth at bedtime.   atorvastatin (LIPITOR) 10 MG tablet Take 10 mg by mouth daily.   Black Elderberry,Berry-Flower, 575 MG CAPS Take 2 capsules by mouth daily.   Calcium 500-125 MG-UNIT TABS Take 2 tablets by mouth daily.   cetirizine (ZYRTEC) 10 MG tablet Take 10 mg  by mouth daily.   EPINEPHrine 0.3 mg/0.3 mL IJ SOAJ injection Inject into thigh for severe allergic reaction   Methylcobalamin (B-12) 5000 MCG TBDP Take 1 tablet by mouth daily.   metoprolol tartrate (LOPRESSOR) 100 MG tablet Take 1 tablet by mouth once for procedure.   Multiple Vitamin (MULTIVITAMIN) capsule Take 1 capsule by mouth daily.   NONFORMULARY OR COMPOUNDED ITEM Allergy Vaccine 1:10 Given at Home   olopatadine (PATANOL) 0.1 % ophthalmic solution Place 1 drop into both eyes in the morning and at bedtime.   OVER THE COUNTER MEDICATION CYSTORENEW   solifenacin (VESICARE) 5 MG tablet Take 5 mg by mouth daily.   Triamcinolone Acetonide (NASACORT AQ NA) Place 2 puffs into the nose daily.     Allergies:    Patient has no known allergies.   Social History: Social History    Socioeconomic History   Marital status: Married    Spouse name: Not on file   Number of children: 2   Years of education: Not on file   Highest education level: Not on file  Occupational History   Occupation: Retired  Tobacco Use   Smoking status: Never    Passive exposure: Never   Smokeless tobacco: Never  Vaping Use   Vaping Use: Never used  Substance and Sexual Activity   Alcohol use: Yes   Drug use: No   Sexual activity: Not on file  Other Topics Concern   Not on file  Social History Narrative   Not on file   Social Determinants of Health   Financial Resource Strain: Not on file  Food Insecurity: Not on file  Transportation Needs: Not on file  Physical Activity: Not on file  Stress: Not on file  Social Connections: Not on file     Family History: The patient's family history includes Allergies in her father and mother; Arthritis in her mother; Asthma in her mother; Breast cancer (age of onset: 40) in her sister; Diabetes in her father; Heart disease in her father and mother. There is no history of Allergic rhinitis, Angioedema, Eczema, Immunodeficiency, or Urticaria.  ROS:   All other ROS reviewed and negative. Pertinent positives noted in the HPI.    EKGs/Labs/Other Studies Reviewed:   The following studies were personally reviewed by me today:  EKG:  EKG is ordered today.  The ekg ordered today demonstrates normal sinus rhythm heart rate 74, poor R wave progression, nonspecific ST-T changes, and was personally reviewed by me.   Recent Labs: 03/30/2020: ALT 20 04/01/2020: BUN 14; Creatinine, Ser 1.07; Hemoglobin 9.0; Platelets 296; Potassium 4.7; Sodium 139   Recent Lipid Panel No results found for: CHOL, TRIG, HDL, CHOLHDL, VLDL, LDLCALC, LDLDIRECT  Physical Exam:   VS:  BP 122/70   Pulse 74   Ht 5' 6.5" (1.689 m)   Wt 165 lb 3.2 oz (74.9 kg)   BMI 26.26 kg/m    Wt Readings from Last 3 Encounters:  08/03/20 165 lb 3.2 oz (74.9 kg)  03/30/20 161 lb  (73 kg)  10/24/19 164 lb 6.4 oz (74.6 kg)    General: Well nourished, well developed, in no acute distress Head: Atraumatic, normal size  Eyes: PEERLA, EOMI  Neck: Supple, no JVD Endocrine: No thryomegaly Cardiac: Normal S1, S2; RRR; faint 2 out of 6 systolic ejection murmur Lungs: Clear to auscultation bilaterally, no wheezing, rhonchi or rales  Abd: Soft, nontender, no hepatomegaly  Ext: No edema, pulses 2+ Musculoskeletal: No deformities, BUE and BLE strength normal and  equal Skin: Warm and dry, no rashes   Neuro: Alert and oriented to person, place, time, and situation, CNII-XII grossly intact, no focal deficits  Psych: Normal mood and affect   ASSESSMENT:   [name] [name] is a 75 y.o. female who presents for the following: 1. SOB (shortness of breath) on exertion   2. Chest pain of uncertain etiology     PLAN:   1. SOB (shortness of breath) on exertion 2. Chest pain of uncertain etiology -Shortness of breath and episodic chest pain since February 2022.  She was admitted to the hospital with sepsis and UTI.  She reports she has no energy.  She needs a repeat TSH. -No evidence of volume overload.  No lower extremity edema.  No symptoms of heart failure.  No clinical evidence of heart failure.  I would like for her to check a BNP.  I would also like to check an echocardiogram.  She has a faint murmur on exam.  Her EKG is nonischemic.  I did review her CT PE study from April.  She has no coronary calcium.  She would be a great candidate for cardiac CTA.  We will plan for 100 mg of metoprolol tartrate to rise before the scan.  She will also need a BMP before the scan.  This will effectively exclude CAD. -Regarding her aortic calcifications she should discontinue her Lipitor 10 mg daily.  Her LDL is less than 100 which is likely acceptable for her. -She will see me back as needed.  All of her testing is normal she will continue taking her statin as this is all I recommend.      Disposition: Return if symptoms worsen or fail to improve.  Medication Adjustments/Labs and Tests Ordered: Current medicines are reviewed at length with the patient today.  Concerns regarding medicines are outlined above.  Orders Placed This Encounter  Procedures   CT CORONARY MORPH W/CTA COR W/SCORE W/CA W/CM &/OR WO/CM   Basic metabolic panel   Brain natriuretic peptide   TSH   EKG 12-Lead   EKG 12-Lead   ECHOCARDIOGRAM COMPLETE   Meds ordered this encounter  Medications   metoprolol tartrate (LOPRESSOR) 100 MG tablet    Sig: Take 1 tablet by mouth once for procedure.    Dispense:  1 tablet    Refill:  0     Patient Instructions  Medication Instructions:  Take Metoprolol 100 mg two hours before CT when scheduled.   *If you need a refill on your cardiac medications before your next appointment, please call your pharmacy*   Lab Work: BMET, BNP, TSH- you may go to our [facility] office ([place], [place] [address], no lab appointment needed)  If you have labs (blood work) drawn today and your tests are completely normal, you will receive your results only by: [place] (if you have MyChart) OR A paper copy in the mail If you have any lab test that is abnormal or we need to change your treatment, we will call you to review the results.   Testing/Procedures: Echocardiogram - Your physician has requested that you have an echocardiogram. Echocardiography is a painless test that uses sound waves to create images of your heart. It provides your doctor with information about the size and shape of your heart and how well your heart's chambers and valves are working. This procedure takes approximately one hour. There are no restrictions for this procedure. This will be performed at our [facility] location -  [address].   Your physician has requested that you have cardiac CT. Cardiac computed tomography (CT) is a painless test that uses an x-ray  machine to take clear, detailed pictures of your heart. For further information please visit [email or web address]. Please follow instruction sheet as given.    Follow-Up: At [facility], you and your health needs are our priority.  As part of our continuing mission to provide you with exceptional heart care, we have created designated Provider Care Teams.  These Care Teams include your primary Cardiologist (physician) and Advanced Practice Providers (APPs -  Physician Assistants and Nurse Practitioners) who all work together to provide you with the care you need, when you need it.  We recommend signing up for the patient portal called "MyChart".  Sign up information is provided on this After Visit Summary.  MyChart is used to connect with patients for Virtual Visits (Telemedicine).  Patients are able to view lab/test results, encounter notes, upcoming appointments, etc.  Non-urgent messages can be sent to your provider as well.   To learn more about what you can do with MyChart, go to [email or web address].    Your next appointment:   As needed  The format for your next appointment:   In Person  Provider:   Eleonore Chiquito, MD   Other Instructions Your cardiac CT will be scheduled at one of the below locations:   [facility] [address] [place], [place] [address] [phone]  [place] [address] [place], [place] [address] [phone]  If scheduled at [facility], please arrive at the [facility] main entrance (entrance A) of [facility] 30 minutes prior to test start time. Proceed to the [facility] Radiology Department (first floor) to check-in and test prep.  If scheduled at [facility], please arrive 15 mins early for check-in and test prep.  Please follow these instructions carefully (unless otherwise directed):  Hold all erectile dysfunction medications  at least 3 days (72 hrs) prior to test.  On the Night Before the Test: Be sure to Drink plenty of water. Do not consume any caffeinated/decaffeinated beverages or chocolate 12 hours prior to your test. Do not take any antihistamines 12 hours prior to your test.   On the Day of the Test: Drink plenty of water until 1 hour prior to the test. Do not eat any food 4 hours prior to the test. You may take your regular medications prior to the test.  Take metoprolol (Lopressor) two hours prior to test. HOLD Furosemide/Hydrochlorothiazide morning of the test. FEMALES- please wear underwire-free bra if available     After the Test: Drink plenty of water. After receiving IV contrast, you may experience a mild flushed feeling. This is normal. On occasion, you may experience a mild rash up to 24 hours after the test. This is not dangerous. If this occurs, you can take Benadryl 25 mg and increase your fluid intake. If you experience trouble breathing, this can be serious. If it is severe call 911 IMMEDIATELY. If it is mild, please call our office. If you take any of these medications: Glipizide/Metformin, Avandament, Glucavance, please do not take 48 hours after completing test unless otherwise instructed.   Once we have confirmed authorization from your insurance company, we will call you to set up a date and time for your test. Based on how quickly your insurance processes prior authorizations requests, please allow  up to 4 weeks to be contacted for scheduling your Cardiac CT appointment. Be advised that routine Cardiac CT appointments could be scheduled as many as 8 weeks after your provider has ordered it.  For non-scheduling related questions, please contact the cardiac imaging nurse navigator should you have any questions/concerns: Marchia Bond, Cardiac Imaging Nurse Navigator Gordy Clement, Cardiac Imaging Nurse Navigator [facility] Direct Office Dial:  [phone]   For scheduling needs, including cancellations and rescheduling, please call [place], [phone].    Signed, Addison Naegeli. Audie Box, MD, [facility]  [address] [place], [place] [address] [phone]  08/03/2020 12:51 PM

## 2020-08-03 ENCOUNTER — Encounter (HOSPITAL_BASED_OUTPATIENT_CLINIC_OR_DEPARTMENT_OTHER): Payer: Self-pay | Admitting: Cardiovascular Disease

## 2020-08-03 ENCOUNTER — Other Ambulatory Visit: Payer: Self-pay

## 2020-08-03 ENCOUNTER — Ambulatory Visit (INDEPENDENT_AMBULATORY_CARE_PROVIDER_SITE_OTHER): Payer: Medicare Other | Admitting: Cardiovascular Disease

## 2020-08-03 VITALS — BP 122/70 | HR 74 | Ht 66.5 in | Wt 165.2 lb

## 2020-08-03 DIAGNOSIS — R0602 Shortness of breath: Secondary | ICD-10-CM | POA: Diagnosis not present

## 2020-08-03 DIAGNOSIS — R079 Chest pain, unspecified: Secondary | ICD-10-CM

## 2020-08-03 MED ORDER — METOPROLOL TARTRATE 100 MG PO TABS: ORAL_TABLET | ORAL | 0 refills | Status: DC

## 2020-08-03 NOTE — Patient Instructions (Signed)
Medication Instructions:  Take Metoprolol 100 mg two hours before CT when scheduled.   *If you need a refill on your cardiac medications before your next appointment, please call your pharmacy*   Lab Work: BMET, BNP, TSH- you may go to our [employer] [facility] [employer] office ([place], [place] [address], no lab appointment needed)  If you have labs (blood work) drawn today and your tests are completely normal, you will receive your results only by: [place] (if you have MyChart) OR A paper copy in the mail If you have any lab test that is abnormal or we need to change your treatment, we will call you to review the results.   Testing/Procedures: Echocardiogram - Your physician has requested that you have an echocardiogram. Echocardiography is a painless test that uses sound waves to create images of your heart. It provides your doctor with information about the size and shape of your heart and how well your heart's chambers and valves are working. This procedure takes approximately one hour. There are no restrictions for this procedure. This will be performed at our [facility] location - [address], [address].   Your physician has requested that you have cardiac CT. Cardiac computed tomography (CT) is a painless test that uses an x-ray machine to take clear, detailed pictures of your heart. For further information please visit [email or web address]. Please follow instruction sheet as given.    Follow-Up: At [facility], you and your health needs are our priority.  As part of our continuing mission to provide you with exceptional heart care, we have created designated Provider Care Teams.  These Care Teams include your primary Cardiologist (physician) and Advanced Practice Providers (APPs -  Physician Assistants and Nurse Practitioners) who all work together to provide you with the care you need, when you need it.  We recommend signing up for the patient portal called "MyChart".  Sign up  information is provided on this After Visit Summary.  MyChart is used to connect with patients for Virtual Visits (Telemedicine).  Patients are able to view lab/test results, encounter notes, upcoming appointments, etc.  Non-urgent messages can be sent to your provider as well.   To learn more about what you can do with MyChart, go to [email or web address].    Your next appointment:   As needed  The format for your next appointment:   In Person  Provider:   Eleonore Chiquito, MD   Other Instructions Your cardiac CT will be scheduled at one of the below locations:   [facility] [address] [place], [place] [address] [phone]  [place] [address] [place], [place] [address] [phone]  If scheduled at [facility], please arrive at the [facility] main entrance (entrance A) of [facility] 30 minutes prior to test start time. Proceed to the [facility] Radiology Department (first floor) to check-in and test prep.  If scheduled at [facility], please arrive 15 mins early for check-in and test prep.  Please follow these instructions carefully (unless otherwise directed):  Hold all erectile dysfunction medications at least 3 days (72 hrs) prior to test.  On the Night Before the Test: Be sure to Drink plenty of water. Do not consume any caffeinated/decaffeinated beverages or chocolate 12 hours prior to your test. Do not take any antihistamines 12 hours prior to your test.   On the Day of the Test: Drink plenty of water until  1 hour prior to the test. Do not eat any food 4 hours prior to the test. You may take your regular medications prior to the test.  Take metoprolol (Lopressor) two hours prior to test. HOLD Furosemide/Hydrochlorothiazide morning of the test. FEMALES- please wear underwire-free bra if available     After the Test: Drink plenty of  water. After receiving IV contrast, you may experience a mild flushed feeling. This is normal. On occasion, you may experience a mild rash up to 24 hours after the test. This is not dangerous. If this occurs, you can take Benadryl 25 mg and increase your fluid intake. If you experience trouble breathing, this can be serious. If it is severe call 911 IMMEDIATELY. If it is mild, please call our office. If you take any of these medications: Glipizide/Metformin, Avandament, Glucavance, please do not take 48 hours after completing test unless otherwise instructed.   Once we have confirmed authorization from your insurance company, we will call you to set up a date and time for your test. Based on how quickly your insurance processes prior authorizations requests, please allow up to 4 weeks to be contacted for scheduling your Cardiac CT appointment. Be advised that routine Cardiac CT appointments could be scheduled as many as 8 weeks after your provider has ordered it.  For non-scheduling related questions, please contact the cardiac imaging nurse navigator should you have any questions/concerns: Marchia Bond, Cardiac Imaging Nurse Navigator Gordy Clement, Cardiac Imaging Nurse Navigator [place] [facility] Direct Office Dial: [phone]   For scheduling needs, including cancellations and rescheduling, please call [place], [phone].

## 2020-08-04 ENCOUNTER — Telehealth: Payer: Self-pay

## 2020-08-04 LAB — BASIC METABOLIC PANEL
BUN/Creatinine Ratio: 11 — ABNORMAL LOW (ref 12–28)
BUN: 11 mg/dL (ref 8–27)
CO2: 25 mmol/L (ref 20–29)
Calcium: 9.5 mg/dL (ref 8.7–10.3)
Chloride: 105 mmol/L (ref 96–106)
Creatinine, Ser: 1.01 mg/dL — ABNORMAL HIGH (ref 0.57–1.00)
Glucose: 83 mg/dL (ref 65–99)
Potassium: 4.9 mmol/L (ref 3.5–5.2)
Sodium: 143 mmol/L (ref 134–144)
eGFR: 59 mL/min/{1.73_m2} — ABNORMAL LOW (ref >59)

## 2020-08-04 LAB — TSH: TSH: 3.62 u[IU]/mL (ref 0.450–4.500)

## 2020-08-04 LAB — BRAIN NATRIURETIC PEPTIDE: BNP: 17.1 pg/mL (ref 0.0–100.0)

## 2020-08-04 NOTE — Telephone Encounter (Signed)
   [facility] Pre-operative Risk Assessment    Patient Name: [name] [name]  DOB: Mar 10, 1945  MRN: [id number]   HEARTCARE STAFF: - Please ensure there is not already an duplicate clearance open for this procedure. - Under Visit Info/Reason for Call, type in Other and utilize the format Clearance MM/DD/YY or Clearance TBD. Do not use dashes or single digits. - If request is for dental extraction, please clarify the # of teeth to be extracted. - If the patient is currently at the dentist's office, call Pre-Op APP to address. If the patient is not currently in the dentist office, please route to the Pre-Op pool  Request for surgical clearance:  What type of surgery is being performed? Cystoscopy with Hydrodistention   When is this surgery scheduled? 09/18/2020   What type of clearance is required (medical clearance vs. Pharmacy clearance to hold med vs. Both)? Medical   Are there any medications that need to be held prior to surgery and how long? NA   Practice name and name of physician performing surgery? [facility]    What is the office phone number? [phone]   7.   What is the office fax number? [phone]  8.   Anesthesia type (None, local, MAC, general) ? Unknown    Ena Dawley 08/04/2020, 5:07 PM  _________________________________________________________________   (provider comments below)

## 2020-08-05 NOTE — Telephone Encounter (Signed)
Scheduled for CT coronary 7/8.

## 2020-08-12 NOTE — Telephone Encounter (Signed)
   Patient Name: [name] [name]  DOB: [birth year]  MRN: [id number]   Primary Cardiologist: Eleonore Chiquito  Chart reviewed as part of pre-operative protocol coverage. Patient recently seen 08/03/20 for shortness of breath with coronary CTA scheduled 08/14/20 and 2D echo scheduled 08/25/20. Await studies before providing clearance recommendation. Will route to requesting provider so they are aware further testing planned.   Charlie Pitter, PA-C 08/12/2020, 11:59 AM

## 2020-08-13 ENCOUNTER — Telehealth (HOSPITAL_COMMUNITY): Payer: Self-pay | Admitting: Emergency Medicine

## 2020-08-13 NOTE — Telephone Encounter (Signed)
Reaching out to patient to offer assistance regarding upcoming cardiac imaging study; pt verbalizes understanding of appt date/time, parking situation and where to check in, pre-test NPO status and medications ordered, and verified current allergies; name and call back number provided for further questions should they arise Marchia Bond RN Navigator Cardiac Imaging [facility] [phone] office [phone] cell  Call dropped halfway thru giving instructions. Attempted to call back but went straight to vm. Left a vm message asking her to call back today. Clarise Cruz

## 2020-08-13 NOTE — Telephone Encounter (Signed)
Continued reviewing instructions Pt verbalized understanding. Denies further questions  100mg  metoprolol tartrate 2 hr prior to scan Denies iv issues Denies claustro  Marchia Bond RN Navigator Cardiac Imaging [facility] [phone] Office  [phone] Cell

## 2020-08-14 ENCOUNTER — Encounter (HOSPITAL_COMMUNITY): Payer: Self-pay

## 2020-08-14 ENCOUNTER — Ambulatory Visit (HOSPITAL_COMMUNITY)
Admission: RE | Admit: 2020-08-14 | Discharge: 2020-08-14 | Disposition: A | Discharge status: Home / Self Care | Payer: Medicare Other | Source: Ambulatory Visit | Attending: Cardiovascular Disease | Admitting: Cardiovascular Disease

## 2020-08-14 ENCOUNTER — Other Ambulatory Visit: Payer: Self-pay

## 2020-08-14 DIAGNOSIS — R911 Solitary pulmonary nodule: Secondary | ICD-10-CM | POA: Insufficient documentation

## 2020-08-14 DIAGNOSIS — I700 Atherosclerosis of aorta: Secondary | ICD-10-CM | POA: Diagnosis not present

## 2020-08-14 DIAGNOSIS — R079 Chest pain, unspecified: Secondary | ICD-10-CM | POA: Insufficient documentation

## 2020-08-14 MED ORDER — NITROGLYCERIN 0.4 MG SL SUBL
0.8000 mg | SUBLINGUAL_TABLET | Freq: Once | SUBLINGUAL | Status: AC
  Administered 2020-08-14: 0.8 mg via SUBLINGUAL

## 2020-08-14 MED ORDER — IOHEXOL 350 MG/ML SOLN
95.0000 mL | Freq: Once | INTRAVENOUS | Status: AC | PRN
  Administered 2020-08-14: 95 mL via INTRAVENOUS

## 2020-08-14 MED ORDER — NITROGLYCERIN 0.4 MG SL SUBL
SUBLINGUAL_TABLET | SUBLINGUAL | Status: AC
  Filled 2020-08-14: qty 2

## 2020-08-21 ENCOUNTER — Ambulatory Visit (INDEPENDENT_AMBULATORY_CARE_PROVIDER_SITE_OTHER): Payer: Medicare Other

## 2020-08-21 DIAGNOSIS — J309 Allergic rhinitis, unspecified: Secondary | ICD-10-CM

## 2020-08-25 ENCOUNTER — Other Ambulatory Visit: Payer: Self-pay

## 2020-08-25 ENCOUNTER — Ambulatory Visit (HOSPITAL_COMMUNITY): Payer: Medicare Other | Attending: Cardiovascular Disease

## 2020-08-25 DIAGNOSIS — R0602 Shortness of breath: Secondary | ICD-10-CM

## 2020-08-25 DIAGNOSIS — J3081 Allergic rhinitis due to animal (cat) (dog) hair and dander: Secondary | ICD-10-CM | POA: Diagnosis not present

## 2020-08-25 LAB — ECHOCARDIOGRAM COMPLETE
Area-P 1/2: 3.97 cm2
S' Lateral: 3 cm

## 2020-08-25 NOTE — Progress Notes (Signed)
VIALS MADE. EXP 08-25-21

## 2020-08-26 DIAGNOSIS — J3089 Other allergic rhinitis: Secondary | ICD-10-CM | POA: Diagnosis not present

## 2020-08-28 NOTE — Telephone Encounter (Signed)
    Patient Name: [name] [name]  DOB: November 29, 1945 MRN: [id number]  Primary Cardiologist: Dr. Audie Box  Chart reviewed as part of pre-operative protocol coverage. Given past medical history and time since last visit, based on ACC/AHA guidelines, [name] [name] would be at acceptable risk for the planned procedure without further cardiovascular testing.   Echo 08/25/20 normal LVEF, no RWMA, mild LVH, gr1DD, no significant valvular abnormality. Cardiac CTA 08/2018 coronary calcium score of 0.   I will route this recommendation to the requesting party via Epic fax function and remove from pre-op pool.  Please call with questions.  Loel Dubonnet, NP 08/28/2020, 4:35 PM

## 2020-09-01 ENCOUNTER — Telehealth: Payer: Self-pay

## 2020-09-01 NOTE — Telephone Encounter (Signed)
Encounter not needed

## 2020-09-16 ENCOUNTER — Ambulatory Visit (INDEPENDENT_AMBULATORY_CARE_PROVIDER_SITE_OTHER): Payer: Medicare Other

## 2020-09-16 DIAGNOSIS — J309 Allergic rhinitis, unspecified: Secondary | ICD-10-CM

## 2020-09-16 DIAGNOSIS — N1831 Chronic kidney disease, stage 3a: Secondary | ICD-10-CM | POA: Diagnosis not present

## 2020-09-16 DIAGNOSIS — I10 Essential (primary) hypertension: Secondary | ICD-10-CM | POA: Diagnosis not present

## 2020-09-16 DIAGNOSIS — E782 Mixed hyperlipidemia: Secondary | ICD-10-CM | POA: Diagnosis not present

## 2020-09-16 DIAGNOSIS — I129 Hypertensive chronic kidney disease with stage 1 through stage 4 chronic kidney disease, or unspecified chronic kidney disease: Secondary | ICD-10-CM | POA: Diagnosis not present

## 2020-09-16 DIAGNOSIS — J45909 Unspecified asthma, uncomplicated: Secondary | ICD-10-CM | POA: Diagnosis not present

## 2020-09-18 DIAGNOSIS — J45909 Unspecified asthma, uncomplicated: Secondary | ICD-10-CM | POA: Diagnosis not present

## 2020-09-18 DIAGNOSIS — N3020 Other chronic cystitis without hematuria: Secondary | ICD-10-CM | POA: Diagnosis not present

## 2020-09-18 DIAGNOSIS — R3915 Urgency of urination: Secondary | ICD-10-CM | POA: Diagnosis not present

## 2020-09-18 DIAGNOSIS — I10 Essential (primary) hypertension: Secondary | ICD-10-CM | POA: Diagnosis not present

## 2020-09-18 DIAGNOSIS — E785 Hyperlipidemia, unspecified: Secondary | ICD-10-CM | POA: Diagnosis not present

## 2020-09-18 DIAGNOSIS — M7918 Myalgia, other site: Secondary | ICD-10-CM | POA: Diagnosis not present

## 2020-09-18 DIAGNOSIS — R350 Frequency of micturition: Secondary | ICD-10-CM | POA: Diagnosis not present

## 2020-09-18 DIAGNOSIS — N3010 Interstitial cystitis (chronic) without hematuria: Secondary | ICD-10-CM | POA: Diagnosis not present

## 2020-10-13 ENCOUNTER — Ambulatory Visit (INDEPENDENT_AMBULATORY_CARE_PROVIDER_SITE_OTHER): Payer: Medicare Other

## 2020-10-13 DIAGNOSIS — J309 Allergic rhinitis, unspecified: Secondary | ICD-10-CM

## 2020-10-19 DIAGNOSIS — R350 Frequency of micturition: Secondary | ICD-10-CM | POA: Diagnosis not present

## 2020-10-19 DIAGNOSIS — R3915 Urgency of urination: Secondary | ICD-10-CM | POA: Diagnosis not present

## 2020-10-19 DIAGNOSIS — N3010 Interstitial cystitis (chronic) without hematuria: Secondary | ICD-10-CM | POA: Diagnosis not present

## 2020-10-19 DIAGNOSIS — Z09 Encounter for follow-up examination after completed treatment for conditions other than malignant neoplasm: Secondary | ICD-10-CM | POA: Diagnosis not present

## 2020-10-22 ENCOUNTER — Ambulatory Visit (INDEPENDENT_AMBULATORY_CARE_PROVIDER_SITE_OTHER): Payer: Medicare Other | Admitting: *Deleted

## 2020-10-22 DIAGNOSIS — J309 Allergic rhinitis, unspecified: Secondary | ICD-10-CM | POA: Diagnosis not present

## 2020-10-28 DIAGNOSIS — B9689 Other specified bacterial agents as the cause of diseases classified elsewhere: Secondary | ICD-10-CM | POA: Diagnosis not present

## 2020-10-28 DIAGNOSIS — J069 Acute upper respiratory infection, unspecified: Secondary | ICD-10-CM | POA: Diagnosis not present

## 2020-10-28 DIAGNOSIS — H1032 Unspecified acute conjunctivitis, left eye: Secondary | ICD-10-CM | POA: Diagnosis not present

## 2020-10-28 DIAGNOSIS — J019 Acute sinusitis, unspecified: Secondary | ICD-10-CM | POA: Diagnosis not present

## 2020-11-02 ENCOUNTER — Ambulatory Visit (INDEPENDENT_AMBULATORY_CARE_PROVIDER_SITE_OTHER): Payer: Medicare Other

## 2020-11-02 DIAGNOSIS — J309 Allergic rhinitis, unspecified: Secondary | ICD-10-CM

## 2020-11-04 DIAGNOSIS — I10 Essential (primary) hypertension: Secondary | ICD-10-CM | POA: Diagnosis not present

## 2020-11-04 DIAGNOSIS — H52223 Regular astigmatism, bilateral: Secondary | ICD-10-CM | POA: Diagnosis not present

## 2020-11-04 DIAGNOSIS — H1033 Unspecified acute conjunctivitis, bilateral: Secondary | ICD-10-CM | POA: Diagnosis not present

## 2020-11-04 DIAGNOSIS — H524 Presbyopia: Secondary | ICD-10-CM | POA: Diagnosis not present

## 2020-11-09 ENCOUNTER — Ambulatory Visit (INDEPENDENT_AMBULATORY_CARE_PROVIDER_SITE_OTHER): Payer: Medicare Other

## 2020-11-09 DIAGNOSIS — J309 Allergic rhinitis, unspecified: Secondary | ICD-10-CM | POA: Diagnosis not present

## 2020-11-16 ENCOUNTER — Ambulatory Visit (INDEPENDENT_AMBULATORY_CARE_PROVIDER_SITE_OTHER): Payer: Medicare Other

## 2020-11-16 DIAGNOSIS — J309 Allergic rhinitis, unspecified: Secondary | ICD-10-CM | POA: Diagnosis not present

## 2020-12-14 ENCOUNTER — Ambulatory Visit (INDEPENDENT_AMBULATORY_CARE_PROVIDER_SITE_OTHER): Payer: Medicare Other | Admitting: *Deleted

## 2020-12-14 DIAGNOSIS — J309 Allergic rhinitis, unspecified: Secondary | ICD-10-CM | POA: Diagnosis not present

## 2020-12-30 DIAGNOSIS — E782 Mixed hyperlipidemia: Secondary | ICD-10-CM | POA: Diagnosis not present

## 2020-12-30 DIAGNOSIS — N1831 Chronic kidney disease, stage 3a: Secondary | ICD-10-CM | POA: Diagnosis not present

## 2020-12-30 DIAGNOSIS — J45909 Unspecified asthma, uncomplicated: Secondary | ICD-10-CM | POA: Diagnosis not present

## 2021-01-06 DIAGNOSIS — M859 Disorder of bone density and structure, unspecified: Secondary | ICD-10-CM | POA: Diagnosis not present

## 2021-01-06 DIAGNOSIS — E782 Mixed hyperlipidemia: Secondary | ICD-10-CM | POA: Diagnosis not present

## 2021-01-06 DIAGNOSIS — I700 Atherosclerosis of aorta: Secondary | ICD-10-CM | POA: Diagnosis not present

## 2021-01-06 DIAGNOSIS — R7303 Prediabetes: Secondary | ICD-10-CM | POA: Diagnosis not present

## 2021-01-06 DIAGNOSIS — N1831 Chronic kidney disease, stage 3a: Secondary | ICD-10-CM | POA: Diagnosis not present

## 2021-01-13 ENCOUNTER — Ambulatory Visit (INDEPENDENT_AMBULATORY_CARE_PROVIDER_SITE_OTHER): Payer: Medicare Other

## 2021-01-13 DIAGNOSIS — J309 Allergic rhinitis, unspecified: Secondary | ICD-10-CM

## 2021-02-09 ENCOUNTER — Ambulatory Visit (INDEPENDENT_AMBULATORY_CARE_PROVIDER_SITE_OTHER): Payer: Medicare Other | Admitting: *Deleted

## 2021-02-09 DIAGNOSIS — J309 Allergic rhinitis, unspecified: Secondary | ICD-10-CM

## 2021-03-19 ENCOUNTER — Ambulatory Visit (INDEPENDENT_AMBULATORY_CARE_PROVIDER_SITE_OTHER): Payer: Medicare Other

## 2021-03-19 DIAGNOSIS — J309 Allergic rhinitis, unspecified: Secondary | ICD-10-CM

## 2021-03-30 NOTE — Progress Notes (Signed)
VIALS EXP 03-30-22

## 2021-03-31 DIAGNOSIS — J3081 Allergic rhinitis due to animal (cat) (dog) hair and dander: Secondary | ICD-10-CM | POA: Diagnosis not present

## 2021-04-01 DIAGNOSIS — J3089 Other allergic rhinitis: Secondary | ICD-10-CM | POA: Diagnosis not present

## 2021-04-15 ENCOUNTER — Ambulatory Visit (INDEPENDENT_AMBULATORY_CARE_PROVIDER_SITE_OTHER): Payer: Medicare Other | Admitting: *Deleted

## 2021-04-15 DIAGNOSIS — J309 Allergic rhinitis, unspecified: Secondary | ICD-10-CM

## 2021-04-21 DIAGNOSIS — R3915 Urgency of urination: Secondary | ICD-10-CM | POA: Diagnosis not present

## 2021-04-21 DIAGNOSIS — Z960 Presence of urogenital implants: Secondary | ICD-10-CM | POA: Diagnosis not present

## 2021-04-21 DIAGNOSIS — N3010 Interstitial cystitis (chronic) without hematuria: Secondary | ICD-10-CM | POA: Diagnosis not present

## 2021-04-21 DIAGNOSIS — R350 Frequency of micturition: Secondary | ICD-10-CM | POA: Diagnosis not present

## 2021-05-18 ENCOUNTER — Ambulatory Visit (INDEPENDENT_AMBULATORY_CARE_PROVIDER_SITE_OTHER): Payer: Medicare Other

## 2021-05-18 DIAGNOSIS — J309 Allergic rhinitis, unspecified: Secondary | ICD-10-CM | POA: Diagnosis not present

## 2021-05-27 ENCOUNTER — Ambulatory Visit: Payer: Medicare Other | Admitting: Allergy & Immunology

## 2021-05-27 ENCOUNTER — Encounter: Payer: Self-pay | Admitting: Allergy & Immunology

## 2021-05-27 VITALS — BP 138/64 | HR 93 | Temp 97.9°F | Resp 16 | Ht 66.5 in | Wt 172.2 lb

## 2021-05-27 DIAGNOSIS — J302 Other seasonal allergic rhinitis: Secondary | ICD-10-CM

## 2021-05-27 DIAGNOSIS — J3089 Other allergic rhinitis: Secondary | ICD-10-CM

## 2021-05-27 DIAGNOSIS — J4520 Mild intermittent asthma, uncomplicated: Secondary | ICD-10-CM

## 2021-05-27 MED ORDER — ALBUTEROL SULFATE HFA 108 (90 BASE) MCG/ACT IN AERS: 2.0000 | INHALATION_SPRAY | Freq: Four times a day (QID) | RESPIRATORY_TRACT | 5 refills | Status: DC | PRN

## 2021-05-27 MED ORDER — EPINEPHRINE 0.3 MG/0.3ML IJ SOAJ: INTRAMUSCULAR | 1 refills | Status: DC

## 2021-05-27 NOTE — Progress Notes (Signed)
? ?FOLLOW UP ? ?Date of Service/Encounter:  05/27/21 ? ? ?Assessment:  ? ?Mild intermittent asthma, uncomplicated ?  ?Seasonal and perennial allergic rhinitis (grasses, weeds, cat, and dust mite) ?  ?Fully vaccinated (spring 2021) with Levan Hurst ? ?Complicated social history, including a husband who is s/p two stem cell transplants for lymphoma  ? ?Plan/Recommendations:  ? ?1. Mild intermittent asthma, uncomplicated ?- Lung testing looks fine today.  ?- Continue with albuterol four puffs at needed.  ? ?2. Chronic rhinitis (grasses, weeds, cat, and dust mite) - maintenance reached July 2018 ?- Continue with allergy shots at the same schedule. ?- We will plan to continue for another year and we  can discuss next year.  ?- Continue with cetirizine '10mg'$  daily.  ?- Continue with Nasacort 1-2 sprays per nostril daily as needed.  ?- Continue with Pataday eye drops daily.  ? ?3. Return in about 1 year (around 05/28/2022).  ? ? ?Subjective:  ? ?[name] [name] is a 76 y.o. female presenting today for follow up of  ?Chief Complaint  ?Patient presents with  ? Asthma  ?  Been doing ok. Have had some colds this year.  ? Allergic Rhinitis   ?  Pollen is really bad this year. Got some sinus drainage pretty bad.  ? Other  ?  Went to the ER in January. Had COVID like symptoms but it was not COVID. Hospital could not figure out what it was per patient.  ? ? ?[name] has a history of the following: ?Patient Active Problem List  ? Diagnosis Date Noted  ? UTI (urinary tract infection) 03/31/2020  ? AKI (acute kidney injury) ([place]) 03/31/2020  ? Anemia 03/31/2020  ? General weakness 03/31/2020  ? Sepsis ([place]) 03/31/2020  ? ALLERGIC CONJUNCTIVITIS 09/26/2007  ? Seasonal and perennial allergic rhinitis 09/26/2007  ? Mild intermittent asthma, uncomplicated 29/47/6546  ? ? ?History obtained from: chart review and patient. ? ?[name] [name] is a 76 y.o. female presenting for a follow up visit.  She was last seen in September 2021.  At that time, we  did not do lung testing.  In we continue with albuterol as needed.  She was doing very well with her breathing.  For her allergic rhinitis, we continue with allergy shots at the same schedule as well as Nasacort and Pataday. ? ?Since the last visit, she has done well.  ? ?Asthma/Respiratory Symptom History: Asthma has been under good control.  She hsa not used her albuterol in a number of months. She would like a refill.  ? ?Allergic Rhinitis Symptom History: Allergies have been a problem lately. She is on cetirizine daily for her bladder and her allergies. This was started two years ago.  She has had some issues for the last couple of weeks with worsening postnasal drip. ? ?[name] [name] is on allergen immunotherapy. She receives one injection. Immunotherapy script #1 contains weeds, grasses, dust mites and cat. She currently receives 0.83m of the RED vial (1/100). She started shots May of 2018 and reached maintenance in July of 2018. She wants to do the shots for another year and see what happens.  ? ?Her husband was in the ICU for [facility] [place] January 2023. He was in the ICU for 4 days and then 9 days in the hospital. [place]. He is doing very well. BAryaniwas told that this might be the end of his life but he pulled right on through. She is  very thankful for how he has done. She has been doing well with regards to spending time with her family.  ? ?Otherwise, there have been no changes to her past medical history, surgical history, family history, or social history. ? ? ? ?Review of Systems  ?Constitutional: Negative.  Negative for chills, fever, malaise/fatigue and weight loss.  ?HENT:  Negative for congestion, ear discharge, ear pain and sinus pain.   ?Eyes:  Negative for pain, discharge and redness.  ?Respiratory:  Negative for cough, sputum production, shortness of breath and wheezing.   ?Cardiovascular: Negative.  Negative for chest pain and palpitations.  ?Gastrointestinal:  Negative  for abdominal pain, constipation, diarrhea, heartburn, nausea and vomiting.  ?Skin: Negative.  Negative for itching and rash.  ?Neurological:  Negative for dizziness and headaches.  ?Endo/Heme/Allergies:  Positive for environmental allergies. Does not bruise/bleed easily.   ? ? ? ?Objective:  ? ?Blood pressure 138/64, pulse 93, temperature 97.9 ?F (36.6 ?C), temperature source Temporal, resp. rate 16, height 5' 6.5" (1.689 m), weight 172 lb 3.2 oz (78.1 kg), SpO2 95 %. ?Body mass index is 27.38 kg/m?. ? ? ? ?Physical Exam ?Vitals reviewed.  ?Constitutional:   ?   Appearance: She is well-developed.  ?   Comments: Very friendly female.  Talkative.  ?HENT:  ?   Head: Normocephalic and atraumatic.  ?   Right Ear: Tympanic membrane, ear canal and external ear normal.  ?   Left Ear: Tympanic membrane, ear canal and external ear normal.  ?   Nose: No nasal deformity, septal deviation, mucosal edema or rhinorrhea.  ?   Right Sinus: No maxillary sinus tenderness or frontal sinus tenderness.  ?   Left Sinus: No maxillary sinus tenderness or frontal sinus tenderness.  ?   Mouth/Throat:  ?   Mouth: Mucous membranes are not pale and not dry.  ?   Pharynx: Uvula midline.  ?Eyes:  ?   General:     ?   Right eye: No discharge.     ?   Left eye: No discharge.  ?   Conjunctiva/sclera: Conjunctivae normal.  ?   Right eye: Right conjunctiva is not injected. No chemosis. ?   Left eye: Left conjunctiva is not injected. No chemosis. ?   Pupils: Pupils are equal, round, and reactive to light.  ?Cardiovascular:  ?   Rate and Rhythm: Normal rate and regular rhythm.  ?   Heart sounds: Normal heart sounds.  ?Pulmonary:  ?   Effort: Pulmonary effort is normal. No tachypnea, accessory muscle usage or respiratory distress.  ?   Breath sounds: Normal breath sounds. No wheezing, rhonchi or rales.  ?Chest:  ?   Chest wall: No tenderness.  ?Lymphadenopathy:  ?   Cervical: No cervical adenopathy.  ?Skin: ?   Coloration: Skin is not pale.  ?    Findings: No abrasion, erythema, petechiae or rash. Rash is not papular, urticarial or vesicular.  ?Neurological:  ?   Mental Status: She is alert.  ?Psychiatric:     ?   Behavior: Behavior is cooperative.  ?  ? ?Diagnostic studies:   ? ?Spirometry: results normal (FEV1: 1.73/77%, FVC: 1.90/65%, FEV1/FVC: 91%).   ?Spirometry consistent with normal pattern.  ? ?Allergy Studies: none ? ? ? ? ? ?  ?Salvatore Marvel, MD  ?[facility] ? ? ? ? ? ?/ ?

## 2021-05-27 NOTE — Patient Instructions (Addendum)
1. Mild intermittent asthma, uncomplicated ?- Lung testing looks fine today.  ?- Continue with albuterol four puffs at needed.  ? ?2. Chronic rhinitis (grasses, weeds, cat, and dust mite) - maintenance reached July 2018 ?- Continue with allergy shots at the same schedule. ?- We will plan to continue for another year and we  can discuss next year.  ?- Continue with cetirizine '10mg'$  daily.  ?- Continue with Nasacort 1-2 sprays per nostril daily as needed.  ?- Continue with Pataday eye drops daily.  ? ?3. Return in about 1 year (around 05/28/2022).  ? ? ?Please inform us of any Emergency Department visits, hospitalizations, or changes in symptoms. Call us before going to the ED for breathing or allergy symptoms since we might be able to fit you in for a sick visit. Feel free to contact us anytime with any questions, problems, or concerns. ? ?It was a pleasure to see you again today! ? ?Websites that have reliable patient information: ?1. American Academy of Asthma, Allergy, and Immunology: www.aaaai.org ?2. Food Allergy Research and Education (FARE): foodallergy.org ?3. Mothers of Asthmatics: http://www.asthmacommunitynetwork.org ?4. [employer] of Allergy, Asthma, and Immunology: MonthlyElectricBill.co.uk ? ? ?COVID-19 Vaccine Information can be found at: ShippingScam.co.uk For questions related to vaccine distribution or appointments, please email [email or web address] or call [phone].  ? ? ? ??Like? [place] on Facebook and Instagram for our latest updates!  ?  ? ? ? ?Make sure you are registered to vote! If you have moved or changed any of your contact information, you will need to get this updated before voting! ? ?In some cases, you MAY be able to register to vote online: [email or web address] ? ? ? ? ?

## 2021-06-02 ENCOUNTER — Other Ambulatory Visit: Payer: Self-pay | Admitting: Family Medicine

## 2021-06-02 DIAGNOSIS — Z1231 Encounter for screening mammogram for malignant neoplasm of breast: Secondary | ICD-10-CM

## 2021-06-10 ENCOUNTER — Ambulatory Visit (INDEPENDENT_AMBULATORY_CARE_PROVIDER_SITE_OTHER): Payer: Medicare Other

## 2021-06-10 DIAGNOSIS — J309 Allergic rhinitis, unspecified: Secondary | ICD-10-CM

## 2021-06-17 ENCOUNTER — Ambulatory Visit (INDEPENDENT_AMBULATORY_CARE_PROVIDER_SITE_OTHER): Payer: Medicare Other

## 2021-06-17 DIAGNOSIS — J309 Allergic rhinitis, unspecified: Secondary | ICD-10-CM | POA: Diagnosis not present

## 2021-06-24 ENCOUNTER — Ambulatory Visit (INDEPENDENT_AMBULATORY_CARE_PROVIDER_SITE_OTHER): Payer: Medicare Other

## 2021-06-24 DIAGNOSIS — J309 Allergic rhinitis, unspecified: Secondary | ICD-10-CM | POA: Diagnosis not present

## 2021-06-25 ENCOUNTER — Ambulatory Visit
Admission: RE | Admit: 2021-06-25 | Discharge: 2021-06-25 | Disposition: A | Discharge status: Home / Self Care | Payer: Medicare Other | Source: Ambulatory Visit | Attending: Family Medicine | Admitting: Family Medicine

## 2021-06-25 DIAGNOSIS — Z1231 Encounter for screening mammogram for malignant neoplasm of breast: Secondary | ICD-10-CM | POA: Diagnosis not present

## 2021-07-01 ENCOUNTER — Ambulatory Visit (INDEPENDENT_AMBULATORY_CARE_PROVIDER_SITE_OTHER): Payer: Medicare Other

## 2021-07-01 DIAGNOSIS — J309 Allergic rhinitis, unspecified: Secondary | ICD-10-CM

## 2021-07-08 ENCOUNTER — Ambulatory Visit (INDEPENDENT_AMBULATORY_CARE_PROVIDER_SITE_OTHER): Payer: Medicare Other

## 2021-07-08 DIAGNOSIS — J309 Allergic rhinitis, unspecified: Secondary | ICD-10-CM | POA: Diagnosis not present

## 2021-08-02 DIAGNOSIS — Z0000 Encounter for general adult medical examination without abnormal findings: Secondary | ICD-10-CM | POA: Diagnosis not present

## 2021-08-02 DIAGNOSIS — N1831 Chronic kidney disease, stage 3a: Secondary | ICD-10-CM | POA: Diagnosis not present

## 2021-08-02 DIAGNOSIS — E782 Mixed hyperlipidemia: Secondary | ICD-10-CM | POA: Diagnosis not present

## 2021-08-02 DIAGNOSIS — Z23 Encounter for immunization: Secondary | ICD-10-CM | POA: Diagnosis not present

## 2021-08-02 DIAGNOSIS — R7303 Prediabetes: Secondary | ICD-10-CM | POA: Diagnosis not present

## 2021-08-06 ENCOUNTER — Ambulatory Visit (INDEPENDENT_AMBULATORY_CARE_PROVIDER_SITE_OTHER): Payer: Medicare Other | Admitting: *Deleted

## 2021-08-06 DIAGNOSIS — J309 Allergic rhinitis, unspecified: Secondary | ICD-10-CM

## 2021-08-09 DIAGNOSIS — I700 Atherosclerosis of aorta: Secondary | ICD-10-CM | POA: Diagnosis not present

## 2021-08-09 DIAGNOSIS — R7303 Prediabetes: Secondary | ICD-10-CM | POA: Diagnosis not present

## 2021-08-09 DIAGNOSIS — J4520 Mild intermittent asthma, uncomplicated: Secondary | ICD-10-CM | POA: Diagnosis not present

## 2021-08-09 DIAGNOSIS — E782 Mixed hyperlipidemia: Secondary | ICD-10-CM | POA: Diagnosis not present

## 2021-08-09 DIAGNOSIS — N1831 Chronic kidney disease, stage 3a: Secondary | ICD-10-CM | POA: Diagnosis not present

## 2021-09-02 ENCOUNTER — Ambulatory Visit (INDEPENDENT_AMBULATORY_CARE_PROVIDER_SITE_OTHER): Payer: Medicare Other

## 2021-09-02 DIAGNOSIS — J309 Allergic rhinitis, unspecified: Secondary | ICD-10-CM | POA: Diagnosis not present

## 2021-09-29 ENCOUNTER — Ambulatory Visit (INDEPENDENT_AMBULATORY_CARE_PROVIDER_SITE_OTHER): Payer: Medicare Other

## 2021-09-29 DIAGNOSIS — J309 Allergic rhinitis, unspecified: Secondary | ICD-10-CM

## 2021-10-03 IMAGING — MG DIGITAL SCREENING BILAT W/ TOMO W/ CAD
8 series · 8 of 24 positions shown · non-contrast
Comparison: Previous exam(s).

ACR Breast Density Category a: The breast tissue is almost entirely
fatty.

CLINICAL DATA: Screening.

EXAM:
DIGITAL SCREENING BILATERAL MAMMOGRAM WITH TOMO AND CAD

[L MLO synth-2D]
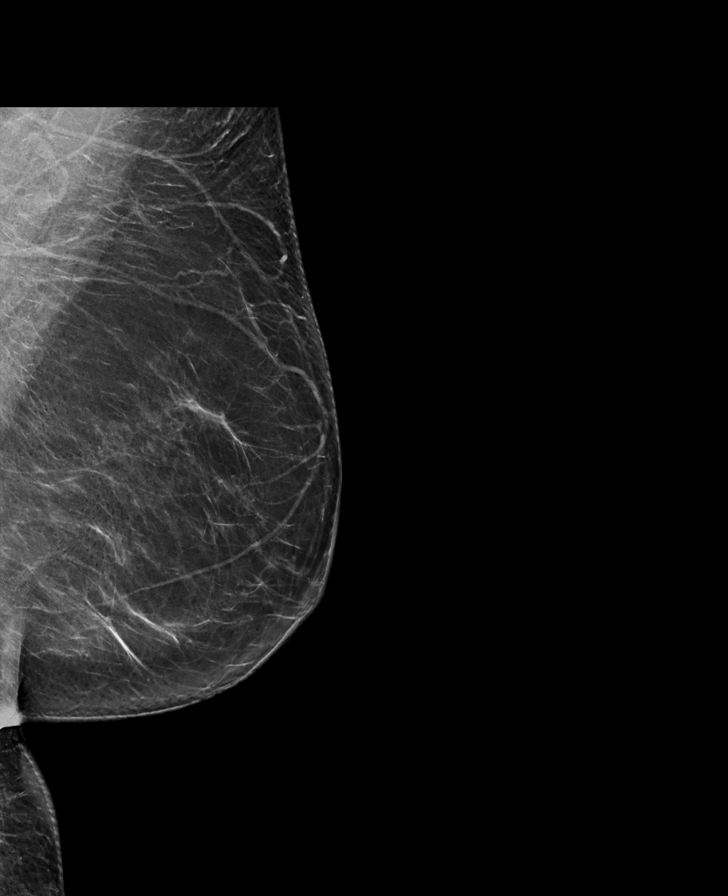

[L CC synth-2D]
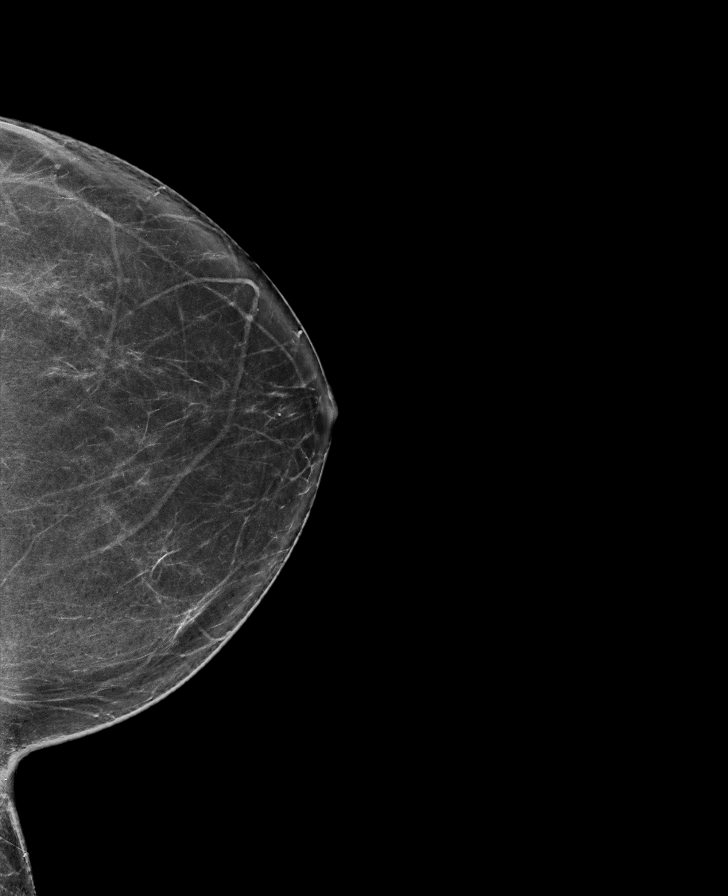

[R CC synth-2D]
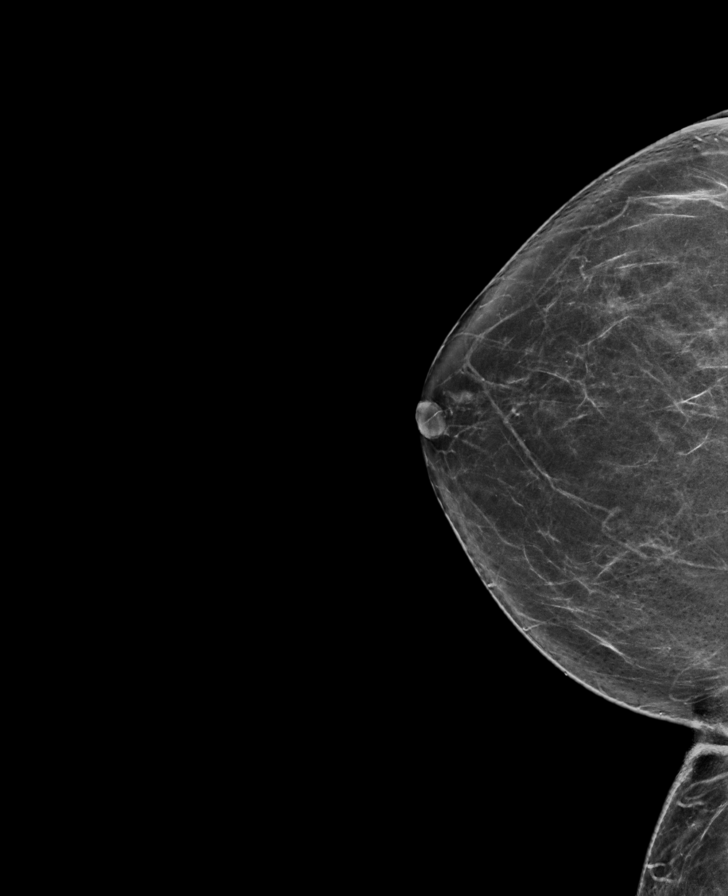

[R MLO synth-2D]
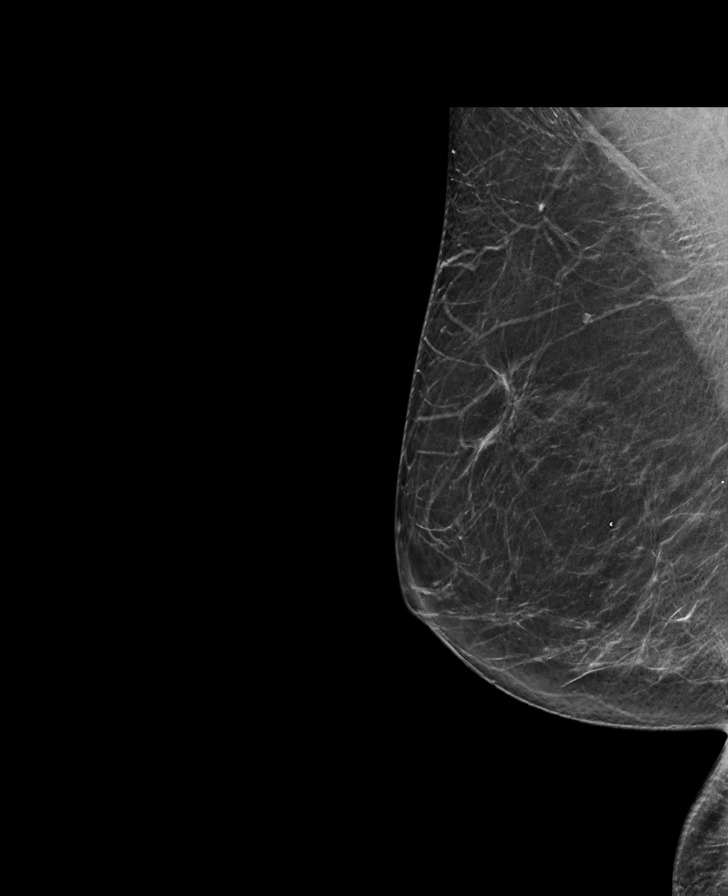

[L MLO tomo · tomo slice 39/76.0]
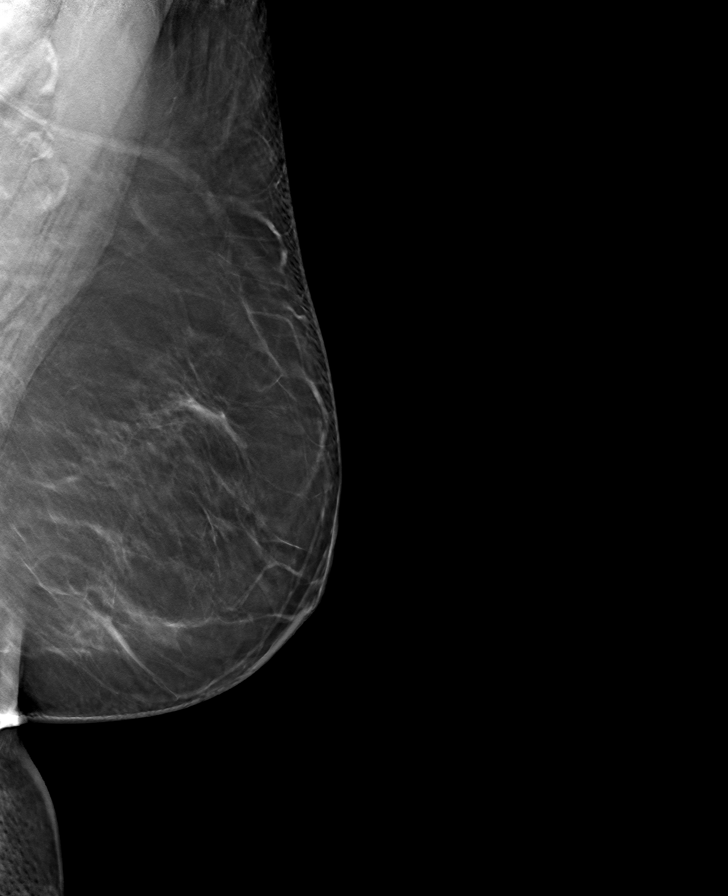

[R MLO tomo · tomo slice 35/70.0]
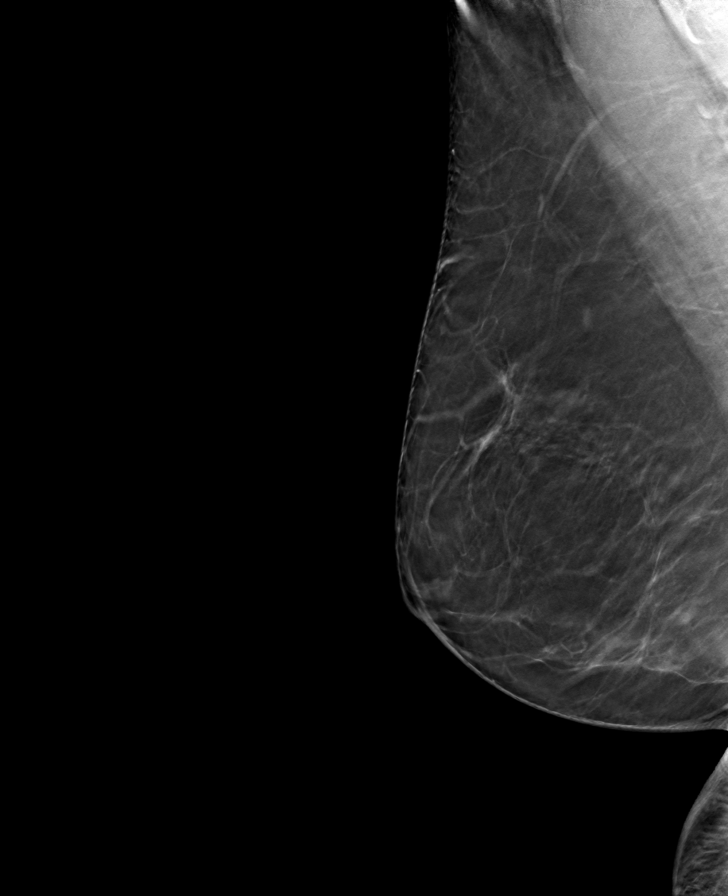

[R CC tomo · tomo slice 35/70.0]
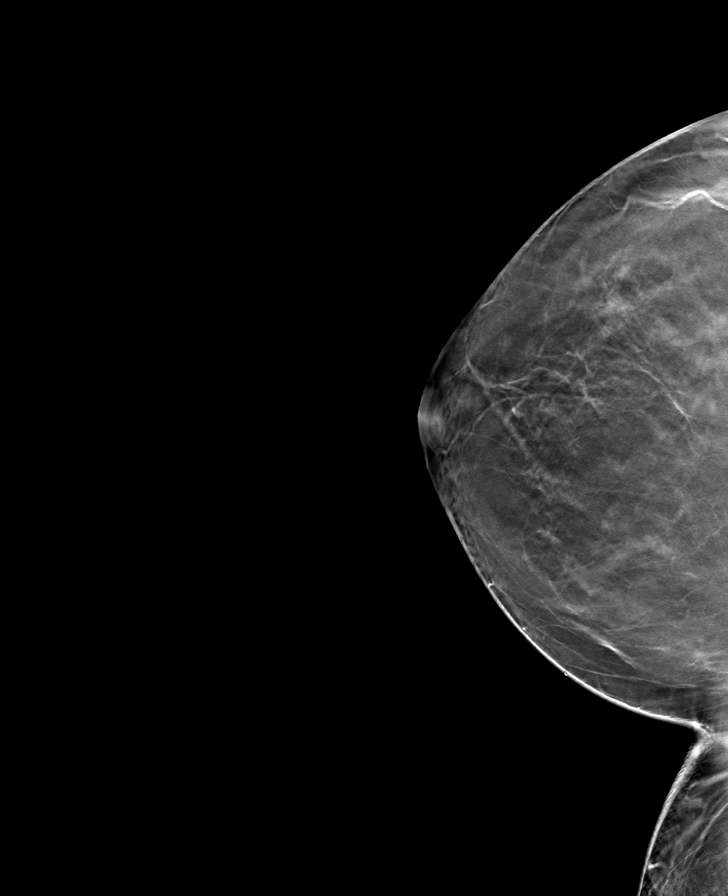

[L CC tomo · tomo slice 37/73.0]
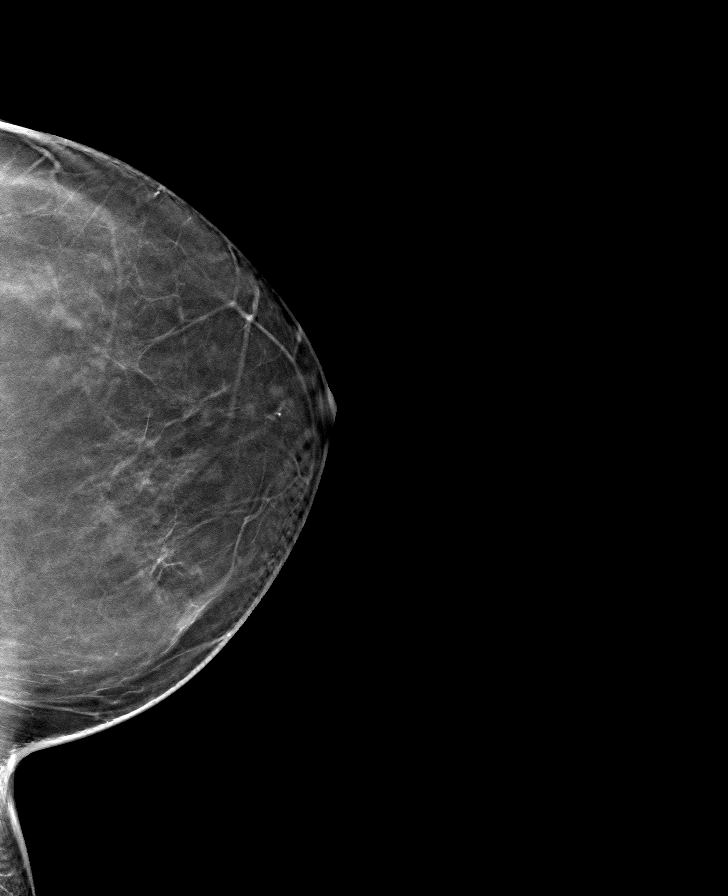

[8 of 24 positions shown; findings below may reference images not displayed]

FINDINGS: There are no findings suspicious for malignancy. Images were
processed with CAD.
IMPRESSION: No mammographic evidence of malignancy. A result letter of this
screening mammogram will be mailed directly to the patient.

RECOMMENDATION:
Screening mammogram in one year. (Code:[id number]-[id number])

BI-RADS CATEGORY  1: Negative.

## 2021-10-28 ENCOUNTER — Ambulatory Visit (INDEPENDENT_AMBULATORY_CARE_PROVIDER_SITE_OTHER): Payer: Medicare Other

## 2021-10-28 DIAGNOSIS — J309 Allergic rhinitis, unspecified: Secondary | ICD-10-CM

## 2021-11-17 DIAGNOSIS — J3081 Allergic rhinitis due to animal (cat) (dog) hair and dander: Secondary | ICD-10-CM | POA: Diagnosis not present

## 2021-11-17 NOTE — Progress Notes (Signed)
VIALS EXP 11-18-22

## 2021-11-18 DIAGNOSIS — J3089 Other allergic rhinitis: Secondary | ICD-10-CM | POA: Diagnosis not present

## 2021-11-24 DIAGNOSIS — L821 Other seborrheic keratosis: Secondary | ICD-10-CM | POA: Diagnosis not present

## 2021-11-24 DIAGNOSIS — L738 Other specified follicular disorders: Secondary | ICD-10-CM | POA: Diagnosis not present

## 2021-11-24 DIAGNOSIS — D229 Melanocytic nevi, unspecified: Secondary | ICD-10-CM | POA: Diagnosis not present

## 2021-11-24 DIAGNOSIS — L814 Other melanin hyperpigmentation: Secondary | ICD-10-CM | POA: Diagnosis not present

## 2021-11-24 DIAGNOSIS — L918 Other hypertrophic disorders of the skin: Secondary | ICD-10-CM | POA: Diagnosis not present

## 2021-11-24 DIAGNOSIS — L578 Other skin changes due to chronic exposure to nonionizing radiation: Secondary | ICD-10-CM | POA: Diagnosis not present

## 2021-12-01 ENCOUNTER — Ambulatory Visit (INDEPENDENT_AMBULATORY_CARE_PROVIDER_SITE_OTHER): Payer: Medicare Other | Admitting: *Deleted

## 2021-12-01 DIAGNOSIS — J309 Allergic rhinitis, unspecified: Secondary | ICD-10-CM

## 2021-12-06 DIAGNOSIS — H53143 Visual discomfort, bilateral: Secondary | ICD-10-CM | POA: Diagnosis not present

## 2021-12-06 DIAGNOSIS — H524 Presbyopia: Secondary | ICD-10-CM | POA: Diagnosis not present

## 2022-01-06 ENCOUNTER — Ambulatory Visit (INDEPENDENT_AMBULATORY_CARE_PROVIDER_SITE_OTHER): Payer: Medicare Other

## 2022-01-06 DIAGNOSIS — J309 Allergic rhinitis, unspecified: Secondary | ICD-10-CM | POA: Diagnosis not present

## 2022-02-08 ENCOUNTER — Ambulatory Visit (INDEPENDENT_AMBULATORY_CARE_PROVIDER_SITE_OTHER): Payer: Medicare Other

## 2022-02-08 DIAGNOSIS — J309 Allergic rhinitis, unspecified: Secondary | ICD-10-CM

## 2022-02-16 ENCOUNTER — Ambulatory Visit (INDEPENDENT_AMBULATORY_CARE_PROVIDER_SITE_OTHER): Payer: Medicare Other

## 2022-02-16 DIAGNOSIS — J309 Allergic rhinitis, unspecified: Secondary | ICD-10-CM | POA: Diagnosis not present

## 2022-02-23 ENCOUNTER — Ambulatory Visit (INDEPENDENT_AMBULATORY_CARE_PROVIDER_SITE_OTHER): Payer: Medicare Other

## 2022-02-23 DIAGNOSIS — J309 Allergic rhinitis, unspecified: Secondary | ICD-10-CM | POA: Diagnosis not present

## 2022-03-02 ENCOUNTER — Ambulatory Visit (INDEPENDENT_AMBULATORY_CARE_PROVIDER_SITE_OTHER): Payer: Medicare Other

## 2022-03-02 DIAGNOSIS — J309 Allergic rhinitis, unspecified: Secondary | ICD-10-CM

## 2022-03-03 DIAGNOSIS — H2513 Age-related nuclear cataract, bilateral: Secondary | ICD-10-CM | POA: Diagnosis not present

## 2022-03-03 DIAGNOSIS — H18413 Arcus senilis, bilateral: Secondary | ICD-10-CM | POA: Diagnosis not present

## 2022-03-03 DIAGNOSIS — H25013 Cortical age-related cataract, bilateral: Secondary | ICD-10-CM | POA: Diagnosis not present

## 2022-03-03 DIAGNOSIS — H25043 Posterior subcapsular polar age-related cataract, bilateral: Secondary | ICD-10-CM | POA: Diagnosis not present

## 2022-03-03 DIAGNOSIS — H2511 Age-related nuclear cataract, right eye: Secondary | ICD-10-CM | POA: Diagnosis not present

## 2022-03-09 ENCOUNTER — Ambulatory Visit (INDEPENDENT_AMBULATORY_CARE_PROVIDER_SITE_OTHER): Payer: Medicare Other

## 2022-03-09 DIAGNOSIS — J309 Allergic rhinitis, unspecified: Secondary | ICD-10-CM

## 2022-03-31 DIAGNOSIS — H25819 Combined forms of age-related cataract, unspecified eye: Secondary | ICD-10-CM | POA: Diagnosis not present

## 2022-04-11 ENCOUNTER — Ambulatory Visit (INDEPENDENT_AMBULATORY_CARE_PROVIDER_SITE_OTHER): Payer: Medicare Other | Admitting: *Deleted

## 2022-04-11 DIAGNOSIS — J309 Allergic rhinitis, unspecified: Secondary | ICD-10-CM | POA: Diagnosis not present

## 2022-04-22 DIAGNOSIS — R350 Frequency of micturition: Secondary | ICD-10-CM | POA: Diagnosis not present

## 2022-04-22 DIAGNOSIS — R3915 Urgency of urination: Secondary | ICD-10-CM | POA: Diagnosis not present

## 2022-04-22 DIAGNOSIS — N3289 Other specified disorders of bladder: Secondary | ICD-10-CM | POA: Diagnosis not present

## 2022-04-22 DIAGNOSIS — N3010 Interstitial cystitis (chronic) without hematuria: Secondary | ICD-10-CM | POA: Diagnosis not present

## 2022-04-22 DIAGNOSIS — Z960 Presence of urogenital implants: Secondary | ICD-10-CM | POA: Diagnosis not present

## 2022-05-10 ENCOUNTER — Encounter: Payer: Self-pay | Admitting: Allergy & Immunology

## 2022-05-10 ENCOUNTER — Ambulatory Visit: Payer: Medicare Other | Admitting: Allergy & Immunology

## 2022-05-10 ENCOUNTER — Other Ambulatory Visit: Payer: Self-pay

## 2022-05-10 ENCOUNTER — Ambulatory Visit: Payer: Self-pay

## 2022-05-10 VITALS — BP 118/72 | HR 71 | Temp 98.3°F | Resp 16 | Ht 65.5 in | Wt 154.5 lb

## 2022-05-10 DIAGNOSIS — J309 Allergic rhinitis, unspecified: Secondary | ICD-10-CM

## 2022-05-10 DIAGNOSIS — J302 Other seasonal allergic rhinitis: Secondary | ICD-10-CM

## 2022-05-10 DIAGNOSIS — J4520 Mild intermittent asthma, uncomplicated: Secondary | ICD-10-CM

## 2022-05-10 MED ORDER — ALBUTEROL SULFATE HFA 108 (90 BASE) MCG/ACT IN AERS: 2.0000 | INHALATION_SPRAY | Freq: Four times a day (QID) | RESPIRATORY_TRACT | 1 refills | Status: AC | PRN

## 2022-05-10 MED ORDER — EPINEPHRINE 0.3 MG/0.3ML IJ SOAJ: INTRAMUSCULAR | 1 refills | Status: DC

## 2022-05-10 NOTE — Progress Notes (Signed)
FOLLOW UP  Date of Service/Encounter:  05/10/22   Assessment:   Mild intermittent asthma, uncomplicated   Seasonal and perennial allergic rhinitis (grasses, weeds, cat, and dust mite) - with worsening symptoms in the early spring and concern for new onset tree pollen allergy, planning for retesting in the next couple of months   Fully vaccinated (spring 2021) with Moderna   Complicated social history, including a husband who is s/p two stem cell transplants for lymphoma   Plan/Recommendations:   1. Mild intermittent asthma, uncomplicated - Lung testing looks fine today.  - Continue with albuterol four puffs at needed.   2. Chronic rhinitis (grasses, weeds, cat, and dust mite) - maintenance reached July 2018 - Continue with allergy shots at the same schedule. - We will plan to do intradermal testing in the future.  - Make an appointment for skin testing in the next 1-2 months so that we have the information to mix your next set of vials.  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  - Continue with cetirizine 10mg  daily.  - Continue with Nasacort 1-2 sprays per nostril daily as needed.  - Continue with Pataday eye drops daily.   3. Return in about 2 months (around 07/10/2022) for SKIN TESTING.    Subjective:   [name] [name] is a 77 y.o. female presenting today for follow up of  Chief Complaint  Patient presents with   Follow-up    Headache, sneezing, runny eyes     [name] [name] has a history of the following: Patient Active Problem List   Diagnosis Date Noted   UTI (urinary tract infection) 03/31/2020   AKI (acute kidney injury) 03/31/2020   Anemia 03/31/2020   General weakness 03/31/2020   Sepsis 03/31/2020   ALLERGIC CONJUNCTIVITIS 09/26/2007   Seasonal and perennial allergic rhinitis 09/26/2007   Mild intermittent asthma, uncomplicated [id number]    History obtained from: chart review and patient.  [name] [name] is  a 77 y.o. female presenting for a follow up visit.  She was last seen in April 2023.  At that time, her lung testing looks stable.  For her rhinitis, we continue with allergy shots at the same schedule.  We also continue with Zyrtec as well as Nasacort.  Since last visit, she has mostly done well.   Asthma/Respiratory Symptom History: She has not needed to use her albuterol in quite some time. She thinks that the allergy shots have been helping with her asthma.  [name] [name]'s asthma has been well controlled. She has not required rescue medication, experienced nocturnal awakenings due to lower respiratory symptoms, nor have activities of daily living been limited. She has required no Emergency Department or Urgent Care visits for her asthma. She has required zero courses of systemic steroids for asthma exacerbations since the last visit. ACT score today is 25, indicating excellent asthma symptom control.   Allergic Rhinitis Symptom History: She largely is doing well, but this season over the last 1-2 months has been irritating.  She has been having her headache which is one of the original reasons she went onto shots. She remains on her cetirizine daily and uses her nasal spray intermittently.   [name] [name] is on allergen immunotherapy. She receives one injection. Immunotherapy script #1 contains weeds, grasses, dust mites and cat. She currently receives 0.48mL of the RED vial (1/100). She started shots May of 2018 and reached maintenance in July of 2018. She does rfeport  that these have helped with her symptoms, but this spring has been particularly difficult for her.    Her husband has largely done well. He has not been in the hospital since he had [place] in January 2023. He does take a lot of naps and they are working out together every morning, which seems to help with his energy.   She has interstitial cystitis. She is being treated with the cetirizine daily which apparently helps with interstitial cystitis.    Otherwise, there have been no changes to her past medical history, surgical history, family history, or social history.    Review of Systems  Constitutional: Negative.  Negative for fever, malaise/fatigue and weight loss.  HENT: Negative.  Negative for congestion, ear discharge, ear pain and sinus pain.   Eyes:  Negative for pain, discharge and redness.  Respiratory:  Negative for cough, sputum production, shortness of breath, wheezing and stridor.   Cardiovascular: Negative.  Negative for chest pain and palpitations.  Gastrointestinal:  Negative for abdominal pain, constipation, diarrhea, heartburn, nausea and vomiting.  Skin: Negative.  Negative for itching and rash.  Neurological:  Positive for headaches. Negative for dizziness.  Endo/Heme/Allergies:  Positive for environmental allergies. Does not bruise/bleed easily.       Objective:   Blood pressure 118/72, pulse 71, temperature 98.3 F (36.8 C), temperature source Temporal, resp. rate 16, height 5' 5.5" (1.664 m), weight 154 lb 8 oz (70.1 kg), SpO2 97 %. Body mass index is 25.32 kg/m.    Physical Exam Vitals reviewed.  Constitutional:      Appearance: She is well-developed.     Comments: Very friendly female.  Talkative.  HENT:     Head: Normocephalic and atraumatic.     Right Ear: Tympanic membrane, ear canal and external ear normal.     Left Ear: Tympanic membrane, ear canal and external ear normal.     Nose: No nasal deformity, septal deviation, mucosal edema or rhinorrhea.     Right Turbinates: Enlarged, swollen and pale.     Left Turbinates: Enlarged, swollen and pale.     Right Sinus: No maxillary sinus tenderness or frontal sinus tenderness.     Left Sinus: No maxillary sinus tenderness or frontal sinus tenderness.     Mouth/Throat:     Mouth: Mucous membranes are not pale and not dry.     Pharynx: Uvula midline.  Eyes:     General:        Right eye: No discharge.        Left eye: No discharge.      Conjunctiva/sclera: Conjunctivae normal.     Right eye: Right conjunctiva is not injected. No chemosis.    Left eye: Left conjunctiva is not injected. No chemosis.    Pupils: Pupils are equal, round, and reactive to light.  Cardiovascular:     Rate and Rhythm: Normal rate and regular rhythm.     Heart sounds: Normal heart sounds.  Pulmonary:     Effort: Pulmonary effort is normal. No tachypnea, accessory muscle usage or respiratory distress.     Breath sounds: Normal breath sounds. No wheezing, rhonchi or rales.  Chest:     Chest wall: No tenderness.  Lymphadenopathy:     Cervical: No cervical adenopathy.  Skin:    Coloration: Skin is not pale.     Findings: No abrasion, erythema, petechiae or rash. Rash is not papular, urticarial or vesicular.  Neurological:     Mental Status: She is alert.  Psychiatric:  Behavior: Behavior is cooperative.      Diagnostic studies: labs sent instead  Spirometry: results normal (FEV1: 2.13/96%, FVC: 2.21/76%, FEV1/FVC: 96%).    Spirometry consistent with normal pattern.   Allergy Studies: none        Salvatore Marvel, MD  [facility]

## 2022-05-10 NOTE — Patient Instructions (Addendum)
1. Mild intermittent asthma, uncomplicated - Lung testing looks fine today.  - Continue with albuterol four puffs at needed.   2. Chronic rhinitis (grasses, weeds, cat, and dust mite) - maintenance reached July 2018 - Continue with allergy shots at the same schedule. - We will plan to do intradermal testing in the future.  - Make an appointment for skin testing in the next 1-2 months so that we have the information to mix your next set of vials.  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  - Continue with cetirizine 10mg  daily.  - Continue with Nasacort 1-2 sprays per nostril daily as needed.  - Continue with Pataday eye drops daily.   3. Return in about 2 months (around 07/10/2022) for SKIN TESTING.    Please inform us of any Emergency Department visits, hospitalizations, or changes in symptoms. Call us before going to the ED for breathing or allergy symptoms since we might be able to fit you in for a sick visit. Feel free to contact us anytime with any questions, problems, or concerns.  It was a pleasure to see you again today!  Websites that have reliable patient information: 1. American Academy of Asthma, Allergy, and Immunology: www.aaaai.org 2. Food Allergy Research and Education (FARE): foodallergy.org 3. Mothers of Asthmatics: http://www.asthmacommunitynetwork.org 4. American College of Allergy, Asthma, and Immunology: www.acaai.org   COVID-19 Vaccine Information can be found at: ShippingScam.co.uk For questions related to vaccine distribution or appointments, please email [email or web address] or call [phone].     "Like" [place] on Facebook and Instagram for our latest updates!       Make sure you are registered to vote! If you have moved or changed any of your contact information, you will need to get this updated before voting!  In some cases, you MAY be able to  register to vote online: [email or web address]

## 2022-05-18 DIAGNOSIS — Z961 Presence of intraocular lens: Secondary | ICD-10-CM | POA: Diagnosis not present

## 2022-05-18 DIAGNOSIS — H2511 Age-related nuclear cataract, right eye: Secondary | ICD-10-CM | POA: Diagnosis not present

## 2022-05-19 DIAGNOSIS — H2512 Age-related nuclear cataract, left eye: Secondary | ICD-10-CM | POA: Diagnosis not present

## 2022-05-25 DIAGNOSIS — Z961 Presence of intraocular lens: Secondary | ICD-10-CM | POA: Diagnosis not present

## 2022-05-25 DIAGNOSIS — H2511 Age-related nuclear cataract, right eye: Secondary | ICD-10-CM | POA: Diagnosis not present

## 2022-05-30 ENCOUNTER — Other Ambulatory Visit: Payer: Self-pay | Admitting: Family Medicine

## 2022-05-30 DIAGNOSIS — Z1231 Encounter for screening mammogram for malignant neoplasm of breast: Secondary | ICD-10-CM

## 2022-06-01 DIAGNOSIS — H2512 Age-related nuclear cataract, left eye: Secondary | ICD-10-CM | POA: Diagnosis not present

## 2022-06-01 DIAGNOSIS — Z961 Presence of intraocular lens: Secondary | ICD-10-CM | POA: Diagnosis not present

## 2022-06-01 DIAGNOSIS — H25012 Cortical age-related cataract, left eye: Secondary | ICD-10-CM | POA: Diagnosis not present

## 2022-06-08 ENCOUNTER — Ambulatory Visit (INDEPENDENT_AMBULATORY_CARE_PROVIDER_SITE_OTHER): Payer: Medicare Other | Admitting: *Deleted

## 2022-06-08 DIAGNOSIS — J309 Allergic rhinitis, unspecified: Secondary | ICD-10-CM | POA: Diagnosis not present

## 2022-06-08 DIAGNOSIS — Z961 Presence of intraocular lens: Secondary | ICD-10-CM | POA: Diagnosis not present

## 2022-06-08 DIAGNOSIS — H2512 Age-related nuclear cataract, left eye: Secondary | ICD-10-CM | POA: Diagnosis not present

## 2022-07-06 ENCOUNTER — Ambulatory Visit
Admission: RE | Admit: 2022-07-06 | Discharge: 2022-07-06 | Disposition: A | Discharge status: Home / Self Care | Payer: Medicare Other | Source: Ambulatory Visit | Attending: Family Medicine | Admitting: Family Medicine

## 2022-07-06 DIAGNOSIS — Z1231 Encounter for screening mammogram for malignant neoplasm of breast: Secondary | ICD-10-CM

## 2022-07-12 ENCOUNTER — Other Ambulatory Visit: Payer: Self-pay

## 2022-07-12 ENCOUNTER — Encounter: Payer: Self-pay | Admitting: Allergy & Immunology

## 2022-07-12 ENCOUNTER — Ambulatory Visit: Payer: Medicare Other | Admitting: Allergy & Immunology

## 2022-07-12 VITALS — BP 120/72 | HR 84 | Temp 98.0°F | Resp 12 | Wt 162.7 lb

## 2022-07-12 DIAGNOSIS — J4520 Mild intermittent asthma, uncomplicated: Secondary | ICD-10-CM

## 2022-07-12 DIAGNOSIS — J302 Other seasonal allergic rhinitis: Secondary | ICD-10-CM | POA: Diagnosis not present

## 2022-07-12 DIAGNOSIS — J3089 Other allergic rhinitis: Secondary | ICD-10-CM
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
## 2022-07-12 NOTE — Patient Instructions (Addendum)
1. Mild intermittent asthma, uncomplicated - Lung testing looks fine today.  - Continue with albuterol four puffs at needed.   2. Chronic rhinitis (grasses, weeds, cat, tree) - maintenance reached July 2018 on current vials - Testing was reactive to grasses, ragweed, trees, and cat. - We are going to keep in dust mite since this is clearly a trigger. - We will mix into one vial so you just need one injection.  - We will check and see when we can make a new vial.  - Continue with cetirizine 10mg  daily.  - Continue with Nasacort 1-2 sprays per nostril daily as needed.  - Continue with Pataday eye drops daily.   3. Return in six months or earlier if needed.    Please inform us of any Emergency Department visits, hospitalizations, or changes in symptoms. Call us before going to the ED for breathing or allergy symptoms since we might be able to fit you in for a sick visit. Feel free to contact us anytime with any questions, problems, or concerns.  It was a pleasure to see you again today!  Websites that have reliable patient information: 1. American Academy of Asthma, Allergy, and Immunology: www.aaaai.org 2. Food Allergy Research and Education (FARE): foodallergy.org 3. Mothers of Asthmatics: http://www.asthmacommunitynetwork.org 4. American College of Allergy, Asthma, and Immunology: www.acaai.org   COVID-19 Vaccine Information can be found at: PodExchange.nl For questions related to vaccine distribution or appointments, please email [email or web address] or call [phone].     "Like" [place] on Facebook and Instagram for our latest updates!       Make sure you are registered to vote! If you have moved or changed any of your contact information, you will need to get this updated before voting!  In some cases, you MAY be able to register to vote online: [email or web address]

## 2022-07-13 NOTE — Progress Notes (Signed)
HOLD UNTIL NEEDED.

## 2022-07-13 NOTE — Progress Notes (Signed)
Aeroallergen Immunotherapy (**NOTE NEW SCRIPT**)  Ordering Provider: Dr. Malachi Bonds  Patient Details Name: [name] [name] MRN: [id number] Date of Birth: 04/27/1945  Order 1 of 1  Vial Label: G/RW/T/C/DM  0.3 ml (Volume)  1:20 Concentration -- Bahia 0.3 ml (Volume)  1:20 Concentration -- Johnson 0.6 ml (Volume)  1:20 Concentration -- Ragweed Mix 0.6 ml (Volume)  1:20 Concentration -- Eastern 10 Tree Mix (also Sweet Gum) 0.7 ml (Volume)  1:10 Concentration -- Cat Hair 0.7 ml (Volume)   AU Concentration -- Mite Mix (DF 5,000 & DP 5,000)   3.2  ml Extract Subtotal 1.8  ml Diluent 5.0  ml Maintenance Total  Schedule:  B  Blue Vial (1:100,000): Schedule B (6 doses) Yellow Vial (1:10,000): Schedule B (6 doses) Green Vial (1:1,000): Schedule B (6 doses) Red Vial (1:100): Schedule A (14 doses)  Special Instructions: After completion of the first Red Vial, please space to every two weeks. After completion of the second Red Vial, please space to every 4 weeks. Ok to up dose new vials at 0.106mL --> 0.3 mL --> 0.5 mL. Ok to come twice weekly, if desired, as long as there is 48 hours between injections.

## 2022-08-04 DIAGNOSIS — Z0000 Encounter for general adult medical examination without abnormal findings: Secondary | ICD-10-CM | POA: Diagnosis not present

## 2022-08-09 ENCOUNTER — Ambulatory Visit (INDEPENDENT_AMBULATORY_CARE_PROVIDER_SITE_OTHER): Payer: Medicare Other

## 2022-08-09 DIAGNOSIS — J309 Allergic rhinitis, unspecified: Secondary | ICD-10-CM | POA: Diagnosis not present

## 2022-08-15 DIAGNOSIS — J4520 Mild intermittent asthma, uncomplicated: Secondary | ICD-10-CM | POA: Diagnosis not present

## 2022-08-15 DIAGNOSIS — N3010 Interstitial cystitis (chronic) without hematuria: Secondary | ICD-10-CM | POA: Diagnosis not present

## 2022-08-15 DIAGNOSIS — E559 Vitamin D deficiency, unspecified: Secondary | ICD-10-CM | POA: Diagnosis not present

## 2022-08-15 DIAGNOSIS — K5901 Slow transit constipation: Secondary | ICD-10-CM | POA: Diagnosis not present

## 2022-08-15 DIAGNOSIS — I700 Atherosclerosis of aorta: Secondary | ICD-10-CM | POA: Diagnosis not present

## 2022-08-15 DIAGNOSIS — M859 Disorder of bone density and structure, unspecified: Secondary | ICD-10-CM | POA: Diagnosis not present

## 2022-08-15 DIAGNOSIS — N1831 Chronic kidney disease, stage 3a: Secondary | ICD-10-CM | POA: Diagnosis not present

## 2022-08-15 DIAGNOSIS — E782 Mixed hyperlipidemia: Secondary | ICD-10-CM | POA: Diagnosis not present

## 2022-08-15 DIAGNOSIS — J309 Allergic rhinitis, unspecified: Secondary | ICD-10-CM | POA: Diagnosis not present

## 2022-08-15 DIAGNOSIS — I5189 Other ill-defined heart diseases: Secondary | ICD-10-CM | POA: Diagnosis not present

## 2022-08-15 DIAGNOSIS — R7303 Prediabetes: Secondary | ICD-10-CM | POA: Diagnosis not present

## 2022-09-06 ENCOUNTER — Ambulatory Visit: Payer: Self-pay

## 2022-09-06 DIAGNOSIS — J309 Allergic rhinitis, unspecified: Secondary | ICD-10-CM

## 2022-09-08 DIAGNOSIS — J3089 Other allergic rhinitis: Secondary | ICD-10-CM | POA: Diagnosis not present

## 2022-09-08 NOTE — Progress Notes (Signed)
NEW. VIALS EXP 09-08-23

## 2022-10-03 ENCOUNTER — Ambulatory Visit (INDEPENDENT_AMBULATORY_CARE_PROVIDER_SITE_OTHER): Payer: Medicare Other | Admitting: *Deleted

## 2022-10-03 DIAGNOSIS — J309 Allergic rhinitis, unspecified: Secondary | ICD-10-CM | POA: Diagnosis not present

## 2022-10-11 ENCOUNTER — Ambulatory Visit (INDEPENDENT_AMBULATORY_CARE_PROVIDER_SITE_OTHER): Payer: Medicare Other | Admitting: *Deleted

## 2022-10-11 DIAGNOSIS — J309 Allergic rhinitis, unspecified: Secondary | ICD-10-CM

## 2022-10-18 ENCOUNTER — Ambulatory Visit (INDEPENDENT_AMBULATORY_CARE_PROVIDER_SITE_OTHER): Payer: Medicare Other

## 2022-10-18 DIAGNOSIS — J309 Allergic rhinitis, unspecified: Secondary | ICD-10-CM

## 2022-10-25 ENCOUNTER — Ambulatory Visit (INDEPENDENT_AMBULATORY_CARE_PROVIDER_SITE_OTHER): Payer: Medicare Other | Admitting: *Deleted

## 2022-10-25 DIAGNOSIS — J309 Allergic rhinitis, unspecified: Secondary | ICD-10-CM | POA: Diagnosis not present

## 2022-11-01 ENCOUNTER — Ambulatory Visit (INDEPENDENT_AMBULATORY_CARE_PROVIDER_SITE_OTHER): Payer: Medicare Other

## 2022-11-01 DIAGNOSIS — J309 Allergic rhinitis, unspecified: Secondary | ICD-10-CM | POA: Diagnosis not present

## 2022-11-09 ENCOUNTER — Ambulatory Visit (INDEPENDENT_AMBULATORY_CARE_PROVIDER_SITE_OTHER): Payer: Medicare Other | Admitting: *Deleted

## 2022-11-09 DIAGNOSIS — J309 Allergic rhinitis, unspecified: Secondary | ICD-10-CM | POA: Diagnosis not present

## 2022-11-14 DIAGNOSIS — M2021 Hallux rigidus, right foot: Secondary | ICD-10-CM | POA: Diagnosis not present

## 2022-11-14 DIAGNOSIS — M79671 Pain in right foot: Secondary | ICD-10-CM | POA: Diagnosis not present

## 2022-11-17 ENCOUNTER — Ambulatory Visit (INDEPENDENT_AMBULATORY_CARE_PROVIDER_SITE_OTHER): Payer: Medicare Other

## 2022-11-17 DIAGNOSIS — J309 Allergic rhinitis, unspecified: Secondary | ICD-10-CM | POA: Diagnosis not present

## 2022-11-21 ENCOUNTER — Ambulatory Visit (INDEPENDENT_AMBULATORY_CARE_PROVIDER_SITE_OTHER): Payer: Medicare Other

## 2022-11-21 DIAGNOSIS — J309 Allergic rhinitis, unspecified: Secondary | ICD-10-CM | POA: Diagnosis not present

## 2022-11-24 ENCOUNTER — Ambulatory Visit (INDEPENDENT_AMBULATORY_CARE_PROVIDER_SITE_OTHER): Payer: Medicare Other | Admitting: *Deleted

## 2022-11-24 DIAGNOSIS — J309 Allergic rhinitis, unspecified: Secondary | ICD-10-CM

## 2022-11-28 ENCOUNTER — Ambulatory Visit (INDEPENDENT_AMBULATORY_CARE_PROVIDER_SITE_OTHER): Payer: Medicare Other

## 2022-11-28 DIAGNOSIS — L738 Other specified follicular disorders: Secondary | ICD-10-CM | POA: Diagnosis not present

## 2022-11-28 DIAGNOSIS — D229 Melanocytic nevi, unspecified: Secondary | ICD-10-CM | POA: Diagnosis not present

## 2022-11-28 DIAGNOSIS — J309 Allergic rhinitis, unspecified: Secondary | ICD-10-CM

## 2022-11-28 DIAGNOSIS — L821 Other seborrheic keratosis: Secondary | ICD-10-CM | POA: Diagnosis not present

## 2022-11-28 DIAGNOSIS — D1801 Hemangioma of skin and subcutaneous tissue: Secondary | ICD-10-CM | POA: Diagnosis not present

## 2022-11-28 DIAGNOSIS — L578 Other skin changes due to chronic exposure to nonionizing radiation: Secondary | ICD-10-CM | POA: Diagnosis not present

## 2022-11-28 DIAGNOSIS — L918 Other hypertrophic disorders of the skin: Secondary | ICD-10-CM | POA: Diagnosis not present

## 2022-11-28 DIAGNOSIS — L820 Inflamed seborrheic keratosis: Secondary | ICD-10-CM | POA: Diagnosis not present

## 2022-11-28 DIAGNOSIS — L570 Actinic keratosis: Secondary | ICD-10-CM | POA: Diagnosis not present

## 2022-11-28 DIAGNOSIS — L814 Other melanin hyperpigmentation: Secondary | ICD-10-CM | POA: Diagnosis not present

## 2022-12-01 ENCOUNTER — Ambulatory Visit (INDEPENDENT_AMBULATORY_CARE_PROVIDER_SITE_OTHER): Payer: Medicare Other | Admitting: *Deleted

## 2022-12-01 DIAGNOSIS — J309 Allergic rhinitis, unspecified: Secondary | ICD-10-CM | POA: Diagnosis not present

## 2022-12-05 ENCOUNTER — Ambulatory Visit (INDEPENDENT_AMBULATORY_CARE_PROVIDER_SITE_OTHER): Payer: Medicare Other

## 2022-12-05 DIAGNOSIS — J309 Allergic rhinitis, unspecified: Secondary | ICD-10-CM | POA: Diagnosis not present

## 2022-12-09 ENCOUNTER — Ambulatory Visit (INDEPENDENT_AMBULATORY_CARE_PROVIDER_SITE_OTHER): Payer: Medicare Other

## 2022-12-09 DIAGNOSIS — J309 Allergic rhinitis, unspecified: Secondary | ICD-10-CM

## 2022-12-12 ENCOUNTER — Ambulatory Visit (INDEPENDENT_AMBULATORY_CARE_PROVIDER_SITE_OTHER): Payer: Medicare Other

## 2022-12-12 DIAGNOSIS — J309 Allergic rhinitis, unspecified: Secondary | ICD-10-CM

## 2022-12-16 ENCOUNTER — Ambulatory Visit (INDEPENDENT_AMBULATORY_CARE_PROVIDER_SITE_OTHER): Payer: Medicare Other

## 2022-12-16 DIAGNOSIS — J309 Allergic rhinitis, unspecified: Secondary | ICD-10-CM | POA: Diagnosis not present

## 2022-12-19 ENCOUNTER — Ambulatory Visit (INDEPENDENT_AMBULATORY_CARE_PROVIDER_SITE_OTHER): Payer: Medicare Other

## 2022-12-19 DIAGNOSIS — J309 Allergic rhinitis, unspecified: Secondary | ICD-10-CM | POA: Diagnosis not present

## 2022-12-23 ENCOUNTER — Ambulatory Visit (INDEPENDENT_AMBULATORY_CARE_PROVIDER_SITE_OTHER): Payer: Medicare Other | Admitting: *Deleted

## 2022-12-23 DIAGNOSIS — J309 Allergic rhinitis, unspecified: Secondary | ICD-10-CM

## 2022-12-27 ENCOUNTER — Ambulatory Visit (INDEPENDENT_AMBULATORY_CARE_PROVIDER_SITE_OTHER): Payer: Medicare Other | Admitting: *Deleted

## 2022-12-27 DIAGNOSIS — J309 Allergic rhinitis, unspecified: Secondary | ICD-10-CM

## 2023-01-02 ENCOUNTER — Ambulatory Visit (INDEPENDENT_AMBULATORY_CARE_PROVIDER_SITE_OTHER): Payer: Medicare Other | Admitting: *Deleted

## 2023-01-02 DIAGNOSIS — J309 Allergic rhinitis, unspecified: Secondary | ICD-10-CM

## 2023-01-11 ENCOUNTER — Ambulatory Visit (INDEPENDENT_AMBULATORY_CARE_PROVIDER_SITE_OTHER): Payer: Medicare Other

## 2023-01-11 DIAGNOSIS — J309 Allergic rhinitis, unspecified: Secondary | ICD-10-CM | POA: Diagnosis not present

## 2023-01-19 ENCOUNTER — Ambulatory Visit (INDEPENDENT_AMBULATORY_CARE_PROVIDER_SITE_OTHER): Payer: Medicare Other | Admitting: *Deleted

## 2023-01-19 DIAGNOSIS — J309 Allergic rhinitis, unspecified: Secondary | ICD-10-CM | POA: Diagnosis not present

## 2023-01-24 ENCOUNTER — Ambulatory Visit (INDEPENDENT_AMBULATORY_CARE_PROVIDER_SITE_OTHER): Payer: Medicare Other | Admitting: *Deleted

## 2023-01-24 DIAGNOSIS — J309 Allergic rhinitis, unspecified: Secondary | ICD-10-CM

## 2023-01-30 ENCOUNTER — Ambulatory Visit (INDEPENDENT_AMBULATORY_CARE_PROVIDER_SITE_OTHER): Payer: Medicare Other

## 2023-01-30 DIAGNOSIS — J309 Allergic rhinitis, unspecified: Secondary | ICD-10-CM | POA: Diagnosis not present

## 2023-02-06 ENCOUNTER — Ambulatory Visit (INDEPENDENT_AMBULATORY_CARE_PROVIDER_SITE_OTHER): Payer: Medicare Other | Admitting: *Deleted

## 2023-02-06 DIAGNOSIS — J309 Allergic rhinitis, unspecified: Secondary | ICD-10-CM | POA: Diagnosis not present

## 2023-02-14 ENCOUNTER — Ambulatory Visit (INDEPENDENT_AMBULATORY_CARE_PROVIDER_SITE_OTHER): Payer: Medicare Other | Admitting: *Deleted

## 2023-02-14 DIAGNOSIS — J309 Allergic rhinitis, unspecified: Secondary | ICD-10-CM | POA: Diagnosis not present

## 2023-02-21 ENCOUNTER — Ambulatory Visit (INDEPENDENT_AMBULATORY_CARE_PROVIDER_SITE_OTHER): Payer: Medicare Other | Admitting: *Deleted

## 2023-02-21 DIAGNOSIS — J309 Allergic rhinitis, unspecified: Secondary | ICD-10-CM | POA: Diagnosis not present

## 2023-02-27 ENCOUNTER — Ambulatory Visit (INDEPENDENT_AMBULATORY_CARE_PROVIDER_SITE_OTHER): Payer: Medicare Other | Admitting: *Deleted

## 2023-02-27 DIAGNOSIS — J309 Allergic rhinitis, unspecified: Secondary | ICD-10-CM

## 2023-02-27 NOTE — Progress Notes (Signed)
VIAL EXP 02-27-24

## 2023-02-28 DIAGNOSIS — J3089 Other allergic rhinitis: Secondary | ICD-10-CM | POA: Diagnosis not present

## 2023-03-07 ENCOUNTER — Ambulatory Visit (INDEPENDENT_AMBULATORY_CARE_PROVIDER_SITE_OTHER): Payer: Medicare Other | Admitting: *Deleted

## 2023-03-07 DIAGNOSIS — J309 Allergic rhinitis, unspecified: Secondary | ICD-10-CM

## 2023-03-21 ENCOUNTER — Ambulatory Visit (INDEPENDENT_AMBULATORY_CARE_PROVIDER_SITE_OTHER): Payer: Medicare Other | Admitting: *Deleted

## 2023-03-21 DIAGNOSIS — J309 Allergic rhinitis, unspecified: Secondary | ICD-10-CM

## 2023-04-04 ENCOUNTER — Ambulatory Visit (INDEPENDENT_AMBULATORY_CARE_PROVIDER_SITE_OTHER): Payer: Medicare Other | Admitting: *Deleted

## 2023-04-04 DIAGNOSIS — J309 Allergic rhinitis, unspecified: Secondary | ICD-10-CM

## 2023-04-19 ENCOUNTER — Ambulatory Visit (INDEPENDENT_AMBULATORY_CARE_PROVIDER_SITE_OTHER): Admitting: *Deleted

## 2023-04-19 DIAGNOSIS — J309 Allergic rhinitis, unspecified: Secondary | ICD-10-CM | POA: Diagnosis not present

## 2023-04-24 DIAGNOSIS — N3010 Interstitial cystitis (chronic) without hematuria: Secondary | ICD-10-CM | POA: Diagnosis not present

## 2023-04-24 DIAGNOSIS — Z9682 Presence of neurostimulator: Secondary | ICD-10-CM | POA: Diagnosis not present

## 2023-04-24 DIAGNOSIS — N3289 Other specified disorders of bladder: Secondary | ICD-10-CM | POA: Diagnosis not present

## 2023-04-27 ENCOUNTER — Ambulatory Visit (INDEPENDENT_AMBULATORY_CARE_PROVIDER_SITE_OTHER)

## 2023-04-27 DIAGNOSIS — J309 Allergic rhinitis, unspecified: Secondary | ICD-10-CM

## 2023-05-03 ENCOUNTER — Ambulatory Visit (INDEPENDENT_AMBULATORY_CARE_PROVIDER_SITE_OTHER): Admitting: *Deleted

## 2023-05-03 DIAGNOSIS — J309 Allergic rhinitis, unspecified: Secondary | ICD-10-CM | POA: Diagnosis not present

## 2023-05-15 DIAGNOSIS — Z9849 Cataract extraction status, unspecified eye: Secondary | ICD-10-CM | POA: Diagnosis not present

## 2023-05-15 DIAGNOSIS — H53143 Visual discomfort, bilateral: Secondary | ICD-10-CM | POA: Diagnosis not present

## 2023-05-27 DIAGNOSIS — H43393 Other vitreous opacities, bilateral: Secondary | ICD-10-CM | POA: Diagnosis not present

## 2023-05-31 ENCOUNTER — Ambulatory Visit (INDEPENDENT_AMBULATORY_CARE_PROVIDER_SITE_OTHER): Payer: Self-pay

## 2023-05-31 DIAGNOSIS — J309 Allergic rhinitis, unspecified: Secondary | ICD-10-CM | POA: Diagnosis not present

## 2023-06-16 ENCOUNTER — Other Ambulatory Visit: Payer: Self-pay | Admitting: Family Medicine

## 2023-06-16 DIAGNOSIS — Z0000 Encounter for general adult medical examination without abnormal findings: Secondary | ICD-10-CM

## 2023-06-22 DIAGNOSIS — H43812 Vitreous degeneration, left eye: Secondary | ICD-10-CM | POA: Diagnosis not present

## 2023-06-27 ENCOUNTER — Ambulatory Visit (INDEPENDENT_AMBULATORY_CARE_PROVIDER_SITE_OTHER)

## 2023-06-27 DIAGNOSIS — J309 Allergic rhinitis, unspecified: Secondary | ICD-10-CM | POA: Diagnosis not present

## 2023-07-06 DIAGNOSIS — H26493 Other secondary cataract, bilateral: Secondary | ICD-10-CM | POA: Diagnosis not present

## 2023-07-06 DIAGNOSIS — H26492 Other secondary cataract, left eye: Secondary | ICD-10-CM | POA: Diagnosis not present

## 2023-07-06 DIAGNOSIS — Z961 Presence of intraocular lens: Secondary | ICD-10-CM | POA: Diagnosis not present

## 2023-07-06 DIAGNOSIS — H18413 Arcus senilis, bilateral: Secondary | ICD-10-CM | POA: Diagnosis not present

## 2023-07-06 DIAGNOSIS — H02831 Dermatochalasis of right upper eyelid: Secondary | ICD-10-CM | POA: Diagnosis not present

## 2023-07-07 ENCOUNTER — Ambulatory Visit
Admission: RE | Admit: 2023-07-07 | Discharge: 2023-07-07 | Disposition: A | Discharge status: Home / Self Care | Source: Ambulatory Visit | Attending: Family Medicine | Admitting: Family Medicine

## 2023-07-07 DIAGNOSIS — Z0000 Encounter for general adult medical examination without abnormal findings: Secondary | ICD-10-CM

## 2023-07-07 DIAGNOSIS — Z1231 Encounter for screening mammogram for malignant neoplasm of breast: Secondary | ICD-10-CM | POA: Diagnosis not present

## 2023-07-14 DIAGNOSIS — H2512 Age-related nuclear cataract, left eye: Secondary | ICD-10-CM | POA: Diagnosis not present

## 2023-07-26 ENCOUNTER — Ambulatory Visit (INDEPENDENT_AMBULATORY_CARE_PROVIDER_SITE_OTHER)

## 2023-07-26 DIAGNOSIS — J309 Allergic rhinitis, unspecified: Secondary | ICD-10-CM

## 2023-08-10 DIAGNOSIS — Z0000 Encounter for general adult medical examination without abnormal findings: Secondary | ICD-10-CM | POA: Diagnosis not present

## 2023-08-15 ENCOUNTER — Other Ambulatory Visit (HOSPITAL_BASED_OUTPATIENT_CLINIC_OR_DEPARTMENT_OTHER): Payer: Self-pay | Admitting: Family Medicine

## 2023-08-15 DIAGNOSIS — Z780 Asymptomatic menopausal state: Secondary | ICD-10-CM

## 2023-08-16 DIAGNOSIS — N3010 Interstitial cystitis (chronic) without hematuria: Secondary | ICD-10-CM | POA: Diagnosis not present

## 2023-08-16 DIAGNOSIS — N1831 Chronic kidney disease, stage 3a: Secondary | ICD-10-CM | POA: Diagnosis not present

## 2023-08-16 DIAGNOSIS — E782 Mixed hyperlipidemia: Secondary | ICD-10-CM | POA: Diagnosis not present

## 2023-08-16 DIAGNOSIS — J4520 Mild intermittent asthma, uncomplicated: Secondary | ICD-10-CM | POA: Diagnosis not present

## 2023-08-16 DIAGNOSIS — R7303 Prediabetes: Secondary | ICD-10-CM | POA: Diagnosis not present

## 2023-08-16 DIAGNOSIS — M859 Disorder of bone density and structure, unspecified: Secondary | ICD-10-CM | POA: Diagnosis not present

## 2023-08-16 DIAGNOSIS — I700 Atherosclerosis of aorta: Secondary | ICD-10-CM | POA: Diagnosis not present

## 2023-08-30 ENCOUNTER — Ambulatory Visit (INDEPENDENT_AMBULATORY_CARE_PROVIDER_SITE_OTHER)

## 2023-08-30 DIAGNOSIS — J309 Allergic rhinitis, unspecified: Secondary | ICD-10-CM

## 2023-09-28 ENCOUNTER — Ambulatory Visit (INDEPENDENT_AMBULATORY_CARE_PROVIDER_SITE_OTHER)

## 2023-09-28 DIAGNOSIS — J309 Allergic rhinitis, unspecified: Secondary | ICD-10-CM

## 2023-10-23 DIAGNOSIS — Z9682 Presence of neurostimulator: Secondary | ICD-10-CM | POA: Diagnosis not present

## 2023-10-23 DIAGNOSIS — N3010 Interstitial cystitis (chronic) without hematuria: Secondary | ICD-10-CM | POA: Diagnosis not present

## 2023-10-30 ENCOUNTER — Ambulatory Visit (INDEPENDENT_AMBULATORY_CARE_PROVIDER_SITE_OTHER)

## 2023-10-30 DIAGNOSIS — J309 Allergic rhinitis, unspecified: Secondary | ICD-10-CM | POA: Diagnosis not present

## 2023-11-27 ENCOUNTER — Ambulatory Visit

## 2023-11-27 DIAGNOSIS — J309 Allergic rhinitis, unspecified: Secondary | ICD-10-CM | POA: Diagnosis not present

## 2023-11-28 DIAGNOSIS — L578 Other skin changes due to chronic exposure to nonionizing radiation: Secondary | ICD-10-CM | POA: Diagnosis not present

## 2023-11-28 DIAGNOSIS — D229 Melanocytic nevi, unspecified: Secondary | ICD-10-CM | POA: Diagnosis not present

## 2023-11-28 DIAGNOSIS — L814 Other melanin hyperpigmentation: Secondary | ICD-10-CM | POA: Diagnosis not present

## 2023-11-28 DIAGNOSIS — L821 Other seborrheic keratosis: Secondary | ICD-10-CM | POA: Diagnosis not present

## 2023-11-28 DIAGNOSIS — L570 Actinic keratosis: Secondary | ICD-10-CM | POA: Diagnosis not present

## 2023-11-28 DIAGNOSIS — L820 Inflamed seborrheic keratosis: Secondary | ICD-10-CM | POA: Diagnosis not present

## 2023-12-11 DIAGNOSIS — J3081 Allergic rhinitis due to animal (cat) (dog) hair and dander: Secondary | ICD-10-CM | POA: Diagnosis not present

## 2023-12-11 DIAGNOSIS — J3089 Other allergic rhinitis: Secondary | ICD-10-CM | POA: Diagnosis not present

## 2023-12-11 DIAGNOSIS — J301 Allergic rhinitis due to pollen: Secondary | ICD-10-CM | POA: Diagnosis not present

## 2023-12-11 NOTE — Progress Notes (Signed)
 VIAL MADE ON 12/11/23

## 2023-12-25 ENCOUNTER — Ambulatory Visit (INDEPENDENT_AMBULATORY_CARE_PROVIDER_SITE_OTHER)

## 2023-12-25 DIAGNOSIS — J309 Allergic rhinitis, unspecified: Secondary | ICD-10-CM

## 2023-12-25 DIAGNOSIS — J302 Other seasonal allergic rhinitis: Secondary | ICD-10-CM | POA: Diagnosis not present

## 2024-01-16 ENCOUNTER — Ambulatory Visit

## 2024-01-16 DIAGNOSIS — J309 Allergic rhinitis, unspecified: Secondary | ICD-10-CM

## 2024-01-16 DIAGNOSIS — J302 Other seasonal allergic rhinitis: Secondary | ICD-10-CM | POA: Diagnosis not present

## 2024-01-25 ENCOUNTER — Encounter: Payer: Self-pay | Admitting: Allergy & Immunology

## 2024-01-25 ENCOUNTER — Other Ambulatory Visit: Payer: Self-pay

## 2024-01-25 ENCOUNTER — Ambulatory Visit

## 2024-01-25 ENCOUNTER — Ambulatory Visit: Admitting: Allergy & Immunology

## 2024-01-25 VITALS — BP 120/60 | HR 74 | Temp 98.1°F | Resp 16

## 2024-01-25 DIAGNOSIS — J3089 Other allergic rhinitis: Secondary | ICD-10-CM

## 2024-01-25 DIAGNOSIS — J4520 Mild intermittent asthma, uncomplicated: Secondary | ICD-10-CM | POA: Diagnosis not present

## 2024-01-25 DIAGNOSIS — J302 Other seasonal allergic rhinitis: Secondary | ICD-10-CM

## 2024-01-25 DIAGNOSIS — J309 Allergic rhinitis, unspecified: Secondary | ICD-10-CM

## 2024-01-25 MED ORDER — EPINEPHRINE 0.3 MG/0.3ML IJ SOAJ: INTRAMUSCULAR | 1 refills | Status: AC

## 2024-01-25 NOTE — Patient Instructions (Addendum)
 1. Mild intermittent asthma, uncomplicated - Lung testing not done today. - Continue with albuterol  four puffs as needed.   2. Chronic rhinitis (grasses, weeds, cat, tree) - maintenance reached July 2018 on current vials - Continue with allergy  shots at the same schedule.  - You are doing great with the current regimen. - Continue with cetirizine 10mg  daily.  - Continue with Nasacort  1-2 sprays per nostril daily AS NEEDED.  - Continue with Pataday  eye drops daily AS NEEDED.  3. Return in one year or earlier if needed.    Please inform us  of any Emergency Department visits, hospitalizations, or changes in symptoms. Call us  before going to the ED for breathing or allergy  symptoms since we might be able to fit you in for a sick visit. Feel free to contact us  anytime with any questions, problems, or concerns.  It was a pleasure to see you again today!  Websites that have reliable patient information: 1. American Academy of Asthma, Allergy , and Immunology: www.aaaai.org 2. Food Allergy  Research and Education (FARE): foodallergy.org 3. Mothers of Asthmatics: http://www.asthmacommunitynetwork.org 4. [employer] of Allergy , Asthma, and Immunology: www.acaai.org   COVID-19 Vaccine Information can be found at: podexchange.nl For questions related to vaccine distribution or appointments, please email [email or web address] or call [phone].     Like us  on Facebook and Instagram for our latest updates!       Make sure you are registered to vote! If you have moved or changed any of your contact information, you will need to get this updated before voting!  In some cases, you MAY be able to register to vote online: [email or web address]

## 2024-01-25 NOTE — Progress Notes (Unsigned)
 FOLLOW UP  Date of Service/Encounter:  01/25/2024   Assessment:   Mild intermittent asthma, uncomplicated   Seasonal and perennial allergic rhinitis (grasses, weeds, cat, and dust mite) - with new sensitization to tree pollen and resolved sensitization to dust mite), planning to re-mix vials    Fully vaccinated (spring 2021) with Moderna   Complicated social history, including a husband who is s/p two stem cell transplants for lymphoma   Plan/Recommendations:   There are no Patient Instructions on file for this visit.   Subjective:   [name] [name] is a 78 y.o. female presenting today for follow up of  Chief Complaint  Patient presents with   Follow-up    No concerns or questions, asthma has been good, hasn't had to use inhaler at all    [name] [name] has a history of the following: Patient Active Problem List   Diagnosis Date Noted   UTI (urinary tract infection) 03/31/2020   AKI (acute kidney injury) 03/31/2020   Anemia 03/31/2020   General weakness 03/31/2020   Sepsis (HCC) 03/31/2020   ALLERGIC CONJUNCTIVITIS 09/26/2007   Seasonal and perennial allergic rhinitis 09/26/2007   Mild intermittent asthma, uncomplicated 09/26/2007    History obtained from: chart review and {Persons; PED relatives w/patient:19415::patient}.  Discussed the use of AI scribe software for clinical note transcription with the patient and/or guardian, who gave verbal consent to proceed.  [name] [name] is a 78 y.o. female presenting for {Blank single:19197::a food challenge,a drug challenge,skin testing,a sick visit,an evaluation of ***,a follow up visit}.  She was last seen in June 2024.  At that time, like testing looked great.  We continue with albuterol  4 puffs as needed.  For her rhinitis, she remained on allergen immunotherapy.  She reached maintenance in July 2018.  We continue with Nasacort  as well as Pataday  and cetirizine.  Since last visit,  Asthma/Respiratory Symptom  History: ***  Allergic Rhinitis Symptom History: ***  Food Allergy  Symptom History: ***  Skin Symptom History: ***  GERD Symptom History: ***  Infection Symptom History: ***  Otherwise, there have been no changes to her past medical history, surgical history, family history, or social history.    Review of systems otherwise negative other than that mentioned in the HPI.    Objective:   Blood pressure 120/60, pulse 74, temperature 98.1 F (36.7 C), temperature source Temporal, resp. rate 16, SpO2 99%. There is no height or weight on file to calculate BMI.    Physical Exam   Diagnostic studies: {Blank single:19197::none,deferred due to recent antihistamine use,deferred due to insurance stipulations that require a separate visit for testing,labs sent instead, }  Spirometry: {Blank single:19197::results normal (FEV1: ***%, FVC: ***%, FEV1/FVC: ***%),results abnormal (FEV1: ***%, FVC: ***%, FEV1/FVC: ***%)}.    {Blank single:19197::Spirometry consistent with mild obstructive disease,Spirometry consistent with moderate obstructive disease,Spirometry consistent with severe obstructive disease,Spirometry consistent with possible restrictive disease,Spirometry consistent with mixed obstructive and restrictive disease,Spirometry uninterpretable due to technique,Spirometry consistent with normal pattern}. {Blank single:19197::Albuterol /Atrovent nebulizer,Xopenex/Atrovent nebulizer,Albuterol  nebulizer,Albuterol  four puffs via MDI,Xopenex four puffs via MDI} treatment given in clinic with {Blank single:19197::significant improvement in FEV1 per ATS criteria,significant improvement in FVC per ATS criteria,significant improvement in FEV1 and FVC per ATS criteria,improvement in FEV1, but not significant per ATS criteria,improvement in FVC, but not significant per ATS criteria,improvement in FEV1 and FVC, but not significant per ATS criteria,no  improvement}.  Allergy  Studies: {Blank single:19197::none,deferred due to recent antihistamine use,deferred due to insurance stipulations that require a separate visit for testing,labs  sent instead, }    {Blank single:19197::Allergy  testing results were read and interpreted by myself, documented by clinical staff., }      Marty Shaggy, MD  [facility] 

## 2024-02-05 ENCOUNTER — Ambulatory Visit

## 2024-02-05 DIAGNOSIS — J302 Other seasonal allergic rhinitis: Secondary | ICD-10-CM

## 2024-02-05 DIAGNOSIS — J309 Allergic rhinitis, unspecified: Secondary | ICD-10-CM

## 2024-03-01 ENCOUNTER — Ambulatory Visit (INDEPENDENT_AMBULATORY_CARE_PROVIDER_SITE_OTHER): Admitting: *Deleted

## 2024-03-01 DIAGNOSIS — J302 Other seasonal allergic rhinitis: Secondary | ICD-10-CM

## 2024-05-21 ENCOUNTER — Other Ambulatory Visit (HOSPITAL_BASED_OUTPATIENT_CLINIC_OR_DEPARTMENT_OTHER)
